# Patient Record
Sex: Female | Born: 1950 | Race: White | Hispanic: No | Smoking: Former smoker
Health system: Southern US, Community
[De-identification: ages and names within clinical notes are randomized; demographics above are authoritative.]

## PROBLEM LIST (undated history)

## (undated) DIAGNOSIS — K635 Polyp of colon: Secondary | ICD-10-CM

## (undated) DIAGNOSIS — I2510 Atherosclerotic heart disease of native coronary artery without angina pectoris: Secondary | ICD-10-CM

## (undated) DIAGNOSIS — I447 Left bundle-branch block, unspecified: Secondary | ICD-10-CM

## (undated) DIAGNOSIS — K219 Gastro-esophageal reflux disease without esophagitis: Secondary | ICD-10-CM

## (undated) DIAGNOSIS — I10 Essential (primary) hypertension: Secondary | ICD-10-CM

## (undated) DIAGNOSIS — E785 Hyperlipidemia, unspecified: Secondary | ICD-10-CM

## (undated) DIAGNOSIS — R7303 Prediabetes: Secondary | ICD-10-CM

## (undated) DIAGNOSIS — I771 Stricture of artery: Secondary | ICD-10-CM

## (undated) DIAGNOSIS — J449 Chronic obstructive pulmonary disease, unspecified: Secondary | ICD-10-CM

## (undated) DIAGNOSIS — Z87442 Personal history of urinary calculi: Secondary | ICD-10-CM

## (undated) HISTORY — PX: BREAST BIOPSY: SHX20

## (undated) HISTORY — PX: TONSILLECTOMY: SUR1361

## (undated) HISTORY — PX: CHOLECYSTECTOMY: SHX55

## (undated) HISTORY — PX: HERNIA REPAIR: SHX51

## (undated) HISTORY — PX: ABDOMINAL HYSTERECTOMY: SHX81

---

## 2013-07-23 DIAGNOSIS — Z800 Family history of malignant neoplasm of digestive organs: Secondary | ICD-10-CM | POA: Insufficient documentation

## 2013-07-23 DIAGNOSIS — D126 Benign neoplasm of colon, unspecified: Secondary | ICD-10-CM | POA: Insufficient documentation

## 2013-07-23 DIAGNOSIS — R197 Diarrhea, unspecified: Secondary | ICD-10-CM | POA: Insufficient documentation

## 2013-08-04 ENCOUNTER — Ambulatory Visit: Payer: Self-pay | Admitting: Physician Assistant

## 2013-08-07 ENCOUNTER — Observation Stay: Payer: Self-pay | Admitting: Family Medicine

## 2013-08-07 LAB — URINALYSIS, COMPLETE
BILIRUBIN, UR: NEGATIVE
Bacteria: NONE SEEN
Blood: NEGATIVE
GLUCOSE, UR: NEGATIVE mg/dL (ref 0–75)
Ketone: NEGATIVE
Leukocyte Esterase: NEGATIVE
Nitrite: NEGATIVE
PH: 5 (ref 4.5–8.0)
Protein: 30
SPECIFIC GRAVITY: 1.024 (ref 1.003–1.030)
WBC UR: 4 /HPF (ref 0–5)

## 2013-08-07 LAB — COMPREHENSIVE METABOLIC PANEL
ALK PHOS: 148 U/L — AB
AST: 34 U/L (ref 15–37)
Albumin: 3.5 g/dL (ref 3.4–5.0)
Anion Gap: 7 (ref 7–16)
BILIRUBIN TOTAL: 0.5 mg/dL (ref 0.2–1.0)
BUN: 6 mg/dL — ABNORMAL LOW (ref 7–18)
CALCIUM: 9.1 mg/dL (ref 8.5–10.1)
CHLORIDE: 100 mmol/L (ref 98–107)
CREATININE: 0.55 mg/dL — AB (ref 0.60–1.30)
Co2: 26 mmol/L (ref 21–32)
EGFR (African American): >60
Glucose: 116 mg/dL — ABNORMAL HIGH (ref 65–99)
Osmolality: 265 (ref 275–301)
POTASSIUM: 4.1 mmol/L (ref 3.5–5.1)
SGPT (ALT): 18 U/L (ref 12–78)
Sodium: 133 mmol/L — ABNORMAL LOW (ref 136–145)
TOTAL PROTEIN: 7.6 g/dL (ref 6.4–8.2)

## 2013-08-07 LAB — CBC WITH DIFFERENTIAL/PLATELET
Basophil #: 0.1 10*3/uL (ref 0.0–0.1)
Basophil %: 0.5 %
EOS ABS: 0 10*3/uL (ref 0.0–0.7)
Eosinophil %: 0.1 %
HCT: 37.2 % (ref 35.0–47.0)
HGB: 12.1 g/dL (ref 12.0–16.0)
LYMPHS ABS: 1 10*3/uL (ref 1.0–3.6)
Lymphocyte %: 7.6 %
MCH: 26.8 pg (ref 26.0–34.0)
MCHC: 32.7 g/dL (ref 32.0–36.0)
MCV: 82 fL (ref 80–100)
Monocyte #: 0.4 x10 3/mm (ref 0.2–0.9)
Monocyte %: 2.9 %
NEUTROS ABS: 11.8 10*3/uL — AB (ref 1.4–6.5)
NEUTROS PCT: 88.9 %
Platelet: 294 10*3/uL (ref 150–440)
RBC: 4.54 10*6/uL (ref 3.80–5.20)
RDW: 14.5 % (ref 11.5–14.5)
WBC: 13.3 10*3/uL — AB (ref 3.6–11.0)

## 2013-08-07 LAB — CK TOTAL AND CKMB (NOT AT ARMC)
CK, TOTAL: 52 U/L
CK, Total: 104 U/L
CK-MB: 1 ng/mL (ref 0.5–3.6)
CK-MB: 0.7 ng/mL (ref 0.5–3.6)

## 2013-08-07 LAB — TROPONIN I

## 2013-08-07 LAB — LIPASE, BLOOD: Lipase: 82 U/L (ref 73–393)

## 2013-08-08 LAB — CBC WITH DIFFERENTIAL/PLATELET
BASOS PCT: 0.2 %
Basophil #: 0 10*3/uL (ref 0.0–0.1)
EOS PCT: 0 %
Eosinophil #: 0 10*3/uL (ref 0.0–0.7)
HCT: 31.4 % — ABNORMAL LOW (ref 35.0–47.0)
HGB: 10.2 g/dL — ABNORMAL LOW (ref 12.0–16.0)
Lymphocyte #: 1 10*3/uL (ref 1.0–3.6)
Lymphocyte %: 6.1 %
MCH: 26.7 pg (ref 26.0–34.0)
MCHC: 32.5 g/dL (ref 32.0–36.0)
MCV: 82 fL (ref 80–100)
MONO ABS: 0.9 x10 3/mm (ref 0.2–0.9)
MONOS PCT: 5.3 %
NEUTROS ABS: 14.4 10*3/uL — AB (ref 1.4–6.5)
Neutrophil %: 88.4 %
PLATELETS: 251 10*3/uL (ref 150–440)
RBC: 3.82 10*6/uL (ref 3.80–5.20)
RDW: 14.5 % (ref 11.5–14.5)
WBC: 16.3 10*3/uL — ABNORMAL HIGH (ref 3.6–11.0)

## 2013-08-08 LAB — BASIC METABOLIC PANEL
Anion Gap: 7 (ref 7–16)
BUN: 7 mg/dL (ref 7–18)
CO2: 28 mmol/L (ref 21–32)
Calcium, Total: 8.4 mg/dL — ABNORMAL LOW (ref 8.5–10.1)
Chloride: 99 mmol/L (ref 98–107)
Creatinine: 0.78 mg/dL (ref 0.60–1.30)
EGFR (Non-African Amer.): >60
GLUCOSE: 111 mg/dL — AB (ref 65–99)
Osmolality: 267 (ref 275–301)
POTASSIUM: 3.5 mmol/L (ref 3.5–5.1)
Sodium: 134 mmol/L — ABNORMAL LOW (ref 136–145)

## 2013-08-08 LAB — CK TOTAL AND CKMB (NOT AT ARMC)
CK, TOTAL: 45 U/L
CK-MB: 0.7 ng/mL (ref 0.5–3.6)

## 2013-08-08 LAB — TROPONIN I: Troponin-I: <0.02 ng/mL

## 2013-10-15 DIAGNOSIS — J449 Chronic obstructive pulmonary disease, unspecified: Secondary | ICD-10-CM | POA: Insufficient documentation

## 2013-10-15 DIAGNOSIS — I10 Essential (primary) hypertension: Secondary | ICD-10-CM | POA: Insufficient documentation

## 2013-10-15 DIAGNOSIS — R079 Chest pain, unspecified: Secondary | ICD-10-CM | POA: Insufficient documentation

## 2014-08-29 NOTE — Discharge Summary (Signed)
PATIENT NAME:  Kimberly Woodward, HOWELLS MR#:  [id number] DATE OF BIRTH:  05-Feb-1951  DATE OF ADMISSION:  08/07/2013 DATE OF DISCHARGE:  08/08/2013  REASON FOR ADMISSION: Chest tightness.  HISTORY OF PRESENT ILLNESS: The patient is a very nice 64 year old female who had a history of colonoscopy done the day of admission with removal of 16 polyps done in  [place]. Apparently, the patient was brought to the medicine department with the initiation of complaints of abdominal pain that was radiating to the chest. The patient did not have any other symptoms like shortness of breath or nausea but the pain was excruciating and was concerning as she had never had it before. The patient was evaluated in the emergency department. Her vital signs were stable with a blood pressure of 141/52. Pulse was 76. She was on 2 liters of oxygen with no significant oxygen desaturation, just as of chest pain. The patient was evaluated with CT scan of the abdomen and pelvis  showing thickening of the walls of the cecum and that recent mucosal edema, nonspecific, likely secondary to an irritation versus infection, inflammation, mostly likely secondary to the recent procedure. There were no signs of intestinal rupture. The patient was kept. Cardiac enzymes were done x 3 without any significant findings. There was no ST depression or elevation on the EKG. Recommended for the patient to follow up with primary care physician for the need of future stress test.  As far as the abdominal pain, as mentioned above, the patient was having some inflammation in the peri-cecal area. Likely this could be related to her procedure done today. The patient was started on Ciprofloxacin and Flagyl, as she had Ct findings and s/p coplonoscopy with polyremoval. The patient did well during this hospitalization. Cardiac enzymes were negative. She was discharged in good condition with treatment of antibiotics of Cipro and Flagyl. The patient also was told to  continue her previous medications including Tudorza 400 mcg 2 times daily, Symbicort 160/4.5 twice daily, Proventil 2 puffs 4 times a day, 100 mcg/25 mcg inhaled once a day, Atrovent inhaler 2 puffs 4 times a day, Coreg 25 mg twice daily, atorvastatin 40 mg once daily, Cipro and Flagyl 13 days.   DISCHARGE TIME: I spent about 40 minutes with this discharge.    ____________________________ [place] Sink, MD rsg:lt D: 08/11/2013 23:27:07 ET T: 08/12/2013 00:12:55 ET JOB#: [id number]  cc: [place] Sink, MD, <Dictator> Dawit Tankard America Brown MD ELECTRONICALLY SIGNED 08/12/2013 21:34

## 2014-08-29 NOTE — H&P (Signed)
PATIENT NAME:  Kimberly Woodward, AMMON MR#:  [id number] DATE OF BIRTH:  Aug 13, 1950  DATE OF ADMISSION:  08/07/2013  PRIMARY CARE PHYSICIAN: [facility]  REFERRING PHYSICIAN: Dr. Lenise Arena  CHIEF COMPLAINT: Chest tightness.   Kimberly Woodward is a 64 year old female who had colonoscopy today and removed 16 polyps; 2 of them were greater than 3 cm. She is brought to the Emergency Department with complaints of abdominal pain and some chest tightness that started a few hours after coming back from the procedure. She did not have any nausea, did not have any bloody stools. The patient has been having cough with mild productive sputum. Continues to smoke. No previous history of coronary artery disease. The patient states tightness in the left side of the chest. No radiation, associated with some shortness of breath.   PAST MEDICAL HISTORY:  1.  Hypertension.  2.  Chronic obstructive pulmonary disease.  3.  Hyperlipidemia.  4.  Polypectomy. The patient had 14 polyps removed in December 2014.   ALLERGIES:  1.  PENICILLIN. 2.  CONTRAST DYE.  3.  IVP DYE.   HOME MEDICATIONS:  1.  Symbicort 1 puff 2 times a day.  2.  Proventil 90 mcg 2 puffs 4 times a day.  3.  Micardis 18 mcg once a day.  4.  Ipratropium 2 puffs 4 times a day.  5.  Cholestyramine 0.5 mg 2 times a day.  6.  Coreg 25 mg 2 times a day.  7.  1 puff once a day.  8.  Atorvastatin 40 mg once a day. 9.  Aspirin 81 mg once a day. 10.  Amlodipine 5 mg once a day.   SOCIAL HISTORY: Continues to smoke 1/2 pack a day. Denies drinking alcohol or using illicit drugs. Lives with her husband.   FAMILY HISTORY: Two brothers died of MI in their early 17s. Another brother had colon cancer.   REVIEW OF SYSTEMS:  CONSTITUTIONAL: Severe generalized weakness.  EYES: No change in vision.  ENT: No change in hearing.  RESPIRATORY: Has been having cough with shortness of breath with any exertion.   CARDIOVASCULAR: Has chest tightness today.   GASTROINTESTINAL: No nausea or vomiting. Has abdominal pain.  GENITOURINARY: No dysuria or hematuria.  SKIN: No rash or lesions.  MUSCULOSKELETAL: Has no joint pains. Has generalized body aches.  SKIN: No rash or lesions.  NEUROLOGIC: No weakness or numbness in any part of the body.   PHYSICAL EXAMINATION:  GENERAL: This is a well-built, well-nourished, age-appropriate female lying down on the bed, not in distress.  VITAL SIGNS: Temperature 97.9, pulse 76, blood pressure 141/52, respiratory rate of 16, oxygen saturation is 95% on 2 liters of oxygen.  HEENT: Head normocephalic, atraumatic. No scleral icterus. Conjunctivae normal. Pupils equal and reactive. Extraocular movements are intact. Mucous membranes moist. No pharyngeal erythema.  NECK: Supple. No lymphadenopathy. No JVD. No carotid bruit. No thyromegaly.  CHEST: No focal tenderness. Has bilateral wheezing, coarse breath sounds.  HEART: S1, S2 regular. No murmurs are heard.  ABDOMEN: Bowel sounds plus. Soft. Has tenderness diffusely along the colon. No rebound or  guarding.  EXTREMITIES: No pedal edema. Pulses 2+.  NEUROLOGIC: The patient is alert, oriented to place, person and time. Cranial nerves II through XII intact. Motor 5/5 in upper and lower extremities.  SKIN: No rash or lesions.  MUSCULOSKELETAL: Good range of motion in all the extremities.   LABORATORY DATA:  CBC: WBC of 13.3, hemoglobin 12.1, platelet count of 294. Troponin  less than 0.02. CMP: BUN 6, creatinine of 0.5. The rest of all the values are within normal limits. CK 104. Chest x-ray, one view portable: No acute cardiopulmonary disease. Urinalysis negative for nitrites and leukocyte esterase. CT abdomen and pelvis shows thickening of the wall of the cecum and adjacent mucosal edema that is nonspecific. No pericolonic inflammation.  ASSESSMENT AND PLAN: Kimberly Woodward is a 64 year old female who comes to the Emergency Department with abdominal pain and chest pain after  having a colonoscopy with multiple polyps removed.  1.  Chest pain. The patient has multiple risk factors: Patient's age, tobacco use, family history, hypertension, hyperlipidemia. Admit the patient to a monitored bed, cycle cardiac enzymes x3. If the patient's cardiac enzymes are negative, the patient can follow up as an outpatient for the stress test.  2.  Abdominal pain. The patient is status post polypectomy, multiple, 2 were more than 3 cm size. CT abdomen and pelvis shows perirectal inflammation in the cecal area, The patient has elevated white blood cell count of 13,000. Keep the patient on Cipro and Flagyl.  3.  Tobacco use: Counseled with the patient for 5 minutes, expressed understanding.  4.  Hypertension, currently well controlled.  5.  Keep the patient on deep vein thrombosis prophylaxis with sequential compression devices.   TIME SPENT: 55 minutes.  ____________________________ Monica Becton, MD pv:sw D: 08/08/2013 01:36:31 ET T: 08/08/2013 02:29:13 ET JOB#: [id number]  cc: Monica Becton, MD, <Dictator> Monica Becton MD ELECTRONICALLY SIGNED 08/21/2013 0:25

## 2016-03-01 ENCOUNTER — Ambulatory Visit
Admission: RE | Admit: 2016-03-01 | Discharge: 2016-03-01 | Disposition: A | Discharge status: Home / Self Care | Payer: Medicare Other | Source: Ambulatory Visit | Attending: Internal Medicine | Admitting: Internal Medicine

## 2016-03-01 ENCOUNTER — Encounter
Admission: RE | Disposition: A | Discharge status: Home / Self Care | Payer: Self-pay | Source: Ambulatory Visit | Attending: Internal Medicine

## 2016-03-01 ENCOUNTER — Encounter: Payer: Self-pay | Admitting: *Deleted

## 2016-03-01 DIAGNOSIS — Z87891 Personal history of nicotine dependence: Secondary | ICD-10-CM | POA: Insufficient documentation

## 2016-03-01 DIAGNOSIS — I10 Essential (primary) hypertension: Secondary | ICD-10-CM | POA: Diagnosis not present

## 2016-03-01 DIAGNOSIS — Z7982 Long term (current) use of aspirin: Secondary | ICD-10-CM | POA: Diagnosis not present

## 2016-03-01 DIAGNOSIS — Z91041 Radiographic dye allergy status: Secondary | ICD-10-CM | POA: Diagnosis not present

## 2016-03-01 DIAGNOSIS — I200 Unstable angina: Secondary | ICD-10-CM | POA: Diagnosis present

## 2016-03-01 DIAGNOSIS — Z79899 Other long term (current) drug therapy: Secondary | ICD-10-CM | POA: Diagnosis not present

## 2016-03-01 DIAGNOSIS — E785 Hyperlipidemia, unspecified: Secondary | ICD-10-CM | POA: Insufficient documentation

## 2016-03-01 DIAGNOSIS — E669 Obesity, unspecified: Secondary | ICD-10-CM | POA: Diagnosis not present

## 2016-03-01 DIAGNOSIS — R9439 Abnormal result of other cardiovascular function study: Secondary | ICD-10-CM | POA: Insufficient documentation

## 2016-03-01 DIAGNOSIS — J449 Chronic obstructive pulmonary disease, unspecified: Secondary | ICD-10-CM | POA: Insufficient documentation

## 2016-03-01 DIAGNOSIS — Z6828 Body mass index (BMI) 28.0-28.9, adult: Secondary | ICD-10-CM | POA: Insufficient documentation

## 2016-03-01 DIAGNOSIS — Z8249 Family history of ischemic heart disease and other diseases of the circulatory system: Secondary | ICD-10-CM | POA: Insufficient documentation

## 2016-03-01 HISTORY — PX: CARDIAC CATHETERIZATION: SHX172

## 2016-03-01 HISTORY — DX: Polyp of colon: K63.5

## 2016-03-01 HISTORY — DX: Essential (primary) hypertension: I10

## 2016-03-01 HISTORY — DX: Chronic obstructive pulmonary disease, unspecified: J44.9

## 2016-03-01 HISTORY — DX: Hyperlipidemia, unspecified: E78.5

## 2016-03-01 SURGERY — LEFT HEART CATH AND CORONARY ANGIOGRAPHY
Anesthesia: Moderate Sedation

## 2016-03-01 SURGERY — LEFT HEART CATH AND CORONARY ANGIOGRAPHY
Anesthesia: Moderate Sedation | Laterality: Left

## 2016-03-01 MED ORDER — SODIUM CHLORIDE 0.9% FLUSH: 3.0000 mL | INTRAVENOUS | Status: DC | PRN

## 2016-03-01 MED ORDER — FAMOTIDINE 20 MG PO TABS
20.0000 mg | ORAL_TABLET | Freq: Once | ORAL | Status: AC
Start: 2016-03-01 — End: 2016-03-01
  Administered 2016-03-01: 20 mg via ORAL

## 2016-03-01 MED ORDER — SODIUM CHLORIDE 0.9% FLUSH: 3.0000 mL | Freq: Two times a day (BID) | INTRAVENOUS | Status: DC

## 2016-03-01 MED ORDER — ONDANSETRON HCL 4 MG/2ML IJ SOLN: 4.0000 mg | Freq: Four times a day (QID) | INTRAMUSCULAR | Status: DC | PRN

## 2016-03-01 MED ORDER — IOPAMIDOL (ISOVUE-300) INJECTION 61%
INTRAVENOUS | Status: DC | PRN
  Administered 2016-03-01: 130 mL via INTRA_ARTERIAL

## 2016-03-01 MED ORDER — FENTANYL CITRATE (PF) 100 MCG/2ML IJ SOLN
INTRAMUSCULAR | Status: DC | PRN
  Administered 2016-03-01: 25 ug via INTRAVENOUS

## 2016-03-01 MED ORDER — FAMOTIDINE 20 MG PO TABS
ORAL_TABLET | ORAL | Status: AC
  Administered 2016-03-01: 20 mg via ORAL
  Filled 2016-03-01: qty 1

## 2016-03-01 MED ORDER — FENTANYL CITRATE (PF) 100 MCG/2ML IJ SOLN
INTRAMUSCULAR | Status: AC
Start: 2016-03-01 — End: 2016-03-01
  Filled 2016-03-01: qty 2

## 2016-03-01 MED ORDER — METHYLPREDNISOLONE SODIUM SUCC 125 MG IJ SOLR
INTRAMUSCULAR | Status: AC
  Administered 2016-03-01: 125 mg via INTRAVENOUS
  Filled 2016-03-01: qty 2

## 2016-03-01 MED ORDER — SODIUM CHLORIDE 0.9 % WEIGHT BASED INFUSION: 3.0000 mL/kg/h | INTRAVENOUS | Status: DC

## 2016-03-01 MED ORDER — HEPARIN (PORCINE) IN NACL 2-0.9 UNIT/ML-% IJ SOLN
INTRAMUSCULAR | Status: AC
  Filled 2016-03-01: qty 500

## 2016-03-01 MED ORDER — ASPIRIN 81 MG PO CHEW: 81.0000 mg | CHEWABLE_TABLET | ORAL | Status: DC

## 2016-03-01 MED ORDER — DIPHENHYDRAMINE HCL 25 MG PO CAPS
50.0000 mg | ORAL_CAPSULE | Freq: Once | ORAL | Status: AC
  Administered 2016-03-01: 50 mg via ORAL

## 2016-03-01 MED ORDER — METHYLPREDNISOLONE SODIUM SUCC 125 MG IJ SOLR
125.0000 mg | Freq: Once | INTRAMUSCULAR | Status: AC
  Administered 2016-03-01: 125 mg via INTRAVENOUS

## 2016-03-01 MED ORDER — SODIUM CHLORIDE 0.9 % IV SOLN: 250.0000 mL | INTRAVENOUS | Status: DC | PRN

## 2016-03-01 MED ORDER — ACETAMINOPHEN 325 MG PO TABS: 650.0000 mg | ORAL_TABLET | ORAL | Status: DC | PRN

## 2016-03-01 MED ORDER — DIPHENHYDRAMINE HCL 25 MG PO CAPS
ORAL_CAPSULE | ORAL | Status: AC
Start: 2016-03-01 — End: 2016-03-01
  Administered 2016-03-01: 50 mg via ORAL
  Filled 2016-03-01: qty 2

## 2016-03-01 MED ORDER — SODIUM CHLORIDE 0.9 % WEIGHT BASED INFUSION: 1.0000 mL/kg/h | INTRAVENOUS | Status: DC

## 2016-03-01 MED ORDER — MIDAZOLAM HCL 2 MG/2ML IJ SOLN
INTRAMUSCULAR | Status: DC | PRN
  Administered 2016-03-01: 1 mg via INTRAVENOUS

## 2016-03-01 MED ORDER — ASPIRIN 81 MG PO CHEW
CHEWABLE_TABLET | ORAL | Status: DC
Start: 2016-03-01 — End: 2016-03-01
  Filled 2016-03-01: qty 1

## 2016-03-01 MED ORDER — MIDAZOLAM HCL 2 MG/2ML IJ SOLN
INTRAMUSCULAR | Status: AC
  Filled 2016-03-01: qty 2

## 2016-03-01 SURGICAL SUPPLY — 9 items
CATH INFINITI 5FR ANG PIGTAIL (CATHETERS) ×3 IMPLANT
CATH INFINITI 5FR JL4 (CATHETERS) ×3 IMPLANT
CATH INFINITI JR4 5F (CATHETERS) ×3 IMPLANT
DEVICE CLOSURE MYNXGRIP 5F (Vascular Products) ×3 IMPLANT
KIT MANI 3VAL PERCEP (MISCELLANEOUS) ×3 IMPLANT
NEEDLE PERC 18GX7CM (NEEDLE) ×3 IMPLANT
PACK CARDIAC CATH (CUSTOM PROCEDURE TRAY) ×3 IMPLANT
SHEATH AVANTI 5FR X 11CM (SHEATH) ×3 IMPLANT
WIRE EMERALD 3MM-J .035X150CM (WIRE) ×3 IMPLANT

## 2016-03-01 NOTE — Discharge Instructions (Signed)
Angiogram, Care After °Refer to this sheet in the next few weeks. These instructions provide you with information about caring for yourself after your procedure. Your health care provider may also give you more specific instructions. Your treatment has been planned according to current medical practices, but problems sometimes occur. Call your health care provider if you have any problems or questions after your procedure. °WHAT TO EXPECT AFTER THE PROCEDURE °After your procedure, it is typical to have the following: °· Bruising at the catheter insertion site that usually fades within 1-2 weeks. °· Blood collecting in the tissue (hematoma) that may be painful to the touch. It should usually decrease in size and tenderness within 1-2 weeks. °HOME CARE INSTRUCTIONS °· Take medicines only as directed by your health care provider. °· You may shower 24-48 hours after the procedure or as directed by your health care provider. Remove the bandage (dressing) and gently wash the site with plain soap and water. Pat the area dry with a clean towel. Do not rub the site, because this may cause bleeding. °· Do not take baths, swim, or use a hot tub until your health care provider approves. °· Check your insertion site every day for redness, swelling, or drainage. °· Do not apply powder or lotion to the site. °· Do not lift over 10 lb (4.5 kg) for 5 days after your procedure or as directed by your health care provider. °· Ask your health care provider when it is okay to: °¨ Return to work or school. °¨ Resume usual physical activities or sports. °¨ Resume sexual activity. °· Do not drive home if you are discharged the same day as the procedure. Have someone else drive you. °· You may drive 24 hours after the procedure unless otherwise instructed by your health care provider. °· Do not operate machinery or power tools for 24 hours after the procedure or as directed by your health care provider. °· If your procedure was done as an  outpatient procedure, which means that you went home the same day as your procedure, a responsible adult should be with you for the first 24 hours after you arrive home. °· Keep all follow-up visits as directed by your health care provider. This is important. °SEEK MEDICAL CARE IF: °· You have a fever. °· You have chills. °· You have increased bleeding from the catheter insertion site. Hold pressure on the site. °SEEK IMMEDIATE MEDICAL CARE IF: °· You have unusual pain at the catheter insertion site. °· You have redness, warmth, or swelling at the catheter insertion site. °· You have drainage (other than a small amount of blood on the dressing) from the catheter insertion site. °· The catheter insertion site is bleeding, and the bleeding does not stop after 30 minutes of holding steady pressure on the site. °· The area near or just beyond the catheter insertion site becomes pale, cool, tingly, or numb. °  °This information is not intended to replace advice given to you by your health care provider. Make sure you discuss any questions you have with your health care provider. °  °Document Released: 11/10/2004 Document Revised: 05/15/2014 Document Reviewed: 09/25/2012 °Elsevier Interactive Patient Education ©2016 [employer] ° °

## 2016-06-13 DIAGNOSIS — I2510 Atherosclerotic heart disease of native coronary artery without angina pectoris: Secondary | ICD-10-CM | POA: Insufficient documentation

## 2016-08-01 ENCOUNTER — Other Ambulatory Visit: Payer: Self-pay | Admitting: Family Medicine

## 2016-08-17 ENCOUNTER — Other Ambulatory Visit: Payer: Self-pay | Admitting: Family Medicine

## 2016-08-17 DIAGNOSIS — Z1231 Encounter for screening mammogram for malignant neoplasm of breast: Secondary | ICD-10-CM

## 2016-08-22 ENCOUNTER — Encounter: Payer: Self-pay | Admitting: Radiology

## 2016-08-22 ENCOUNTER — Ambulatory Visit
Admission: RE | Admit: 2016-08-22 | Discharge: 2016-08-22 | Disposition: A | Discharge status: Home / Self Care | Payer: Medicare Other | Source: Ambulatory Visit | Attending: Family Medicine | Admitting: Family Medicine

## 2016-08-22 DIAGNOSIS — Z1231 Encounter for screening mammogram for malignant neoplasm of breast: Secondary | ICD-10-CM | POA: Insufficient documentation

## 2016-08-30 ENCOUNTER — Other Ambulatory Visit: Payer: Self-pay | Admitting: *Deleted

## 2016-08-30 ENCOUNTER — Inpatient Hospital Stay
Admission: RE | Admit: 2016-08-30 | Discharge: 2016-08-30 | Disposition: A | Discharge status: Home / Self Care | Payer: Self-pay | Source: Ambulatory Visit | Attending: *Deleted | Admitting: *Deleted

## 2016-08-30 DIAGNOSIS — Z9289 Personal history of other medical treatment: Secondary | ICD-10-CM

## 2017-03-27 ENCOUNTER — Other Ambulatory Visit: Payer: Self-pay | Admitting: Family Medicine

## 2017-03-27 DIAGNOSIS — I714 Abdominal aortic aneurysm, without rupture, unspecified: Secondary | ICD-10-CM

## 2017-03-27 DIAGNOSIS — I779 Disorder of arteries and arterioles, unspecified: Secondary | ICD-10-CM

## 2017-03-27 DIAGNOSIS — I739 Peripheral vascular disease, unspecified: Secondary | ICD-10-CM

## 2017-04-03 ENCOUNTER — Other Ambulatory Visit: Payer: Self-pay | Admitting: Family Medicine

## 2017-04-03 DIAGNOSIS — R600 Localized edema: Secondary | ICD-10-CM

## 2017-04-05 ENCOUNTER — Ambulatory Visit
Admission: RE | Admit: 2017-04-05 | Discharge: 2017-04-05 | Disposition: A | Discharge status: Home / Self Care | Payer: Medicare Other | Source: Ambulatory Visit | Attending: Family Medicine | Admitting: Family Medicine

## 2017-04-05 DIAGNOSIS — I714 Abdominal aortic aneurysm, without rupture, unspecified: Secondary | ICD-10-CM

## 2017-04-05 DIAGNOSIS — Z136 Encounter for screening for cardiovascular disorders: Secondary | ICD-10-CM | POA: Diagnosis not present

## 2017-04-05 DIAGNOSIS — I150 Renovascular hypertension: Secondary | ICD-10-CM | POA: Diagnosis not present

## 2017-04-05 DIAGNOSIS — R600 Localized edema: Secondary | ICD-10-CM

## 2017-04-05 DIAGNOSIS — I779 Disorder of arteries and arterioles, unspecified: Secondary | ICD-10-CM

## 2017-04-05 DIAGNOSIS — I700 Atherosclerosis of aorta: Secondary | ICD-10-CM | POA: Diagnosis not present

## 2017-04-05 DIAGNOSIS — I739 Peripheral vascular disease, unspecified: Secondary | ICD-10-CM

## 2017-04-09 ENCOUNTER — Ambulatory Visit: Payer: PRIVATE HEALTH INSURANCE

## 2017-06-25 ENCOUNTER — Other Ambulatory Visit: Payer: Self-pay | Admitting: Family Medicine

## 2017-08-17 DIAGNOSIS — R112 Nausea with vomiting, unspecified: Secondary | ICD-10-CM | POA: Insufficient documentation

## 2017-08-23 DIAGNOSIS — R0989 Other specified symptoms and signs involving the circulatory and respiratory systems: Secondary | ICD-10-CM | POA: Insufficient documentation

## 2017-08-23 DIAGNOSIS — I708 Atherosclerosis of other arteries: Secondary | ICD-10-CM | POA: Insufficient documentation

## 2017-10-11 ENCOUNTER — Other Ambulatory Visit: Payer: Self-pay | Admitting: Family Medicine

## 2017-10-11 DIAGNOSIS — Z1231 Encounter for screening mammogram for malignant neoplasm of breast: Secondary | ICD-10-CM

## 2017-10-29 ENCOUNTER — Ambulatory Visit
Admission: RE | Admit: 2017-10-29 | Discharge: 2017-10-29 | Disposition: A | Discharge status: Home / Self Care | Payer: Medicare Other | Source: Ambulatory Visit | Attending: Family Medicine | Admitting: Family Medicine

## 2017-10-29 DIAGNOSIS — Z1231 Encounter for screening mammogram for malignant neoplasm of breast: Secondary | ICD-10-CM | POA: Diagnosis not present

## 2018-01-15 DIAGNOSIS — E119 Type 2 diabetes mellitus without complications: Secondary | ICD-10-CM | POA: Insufficient documentation

## 2018-01-15 DIAGNOSIS — E669 Obesity, unspecified: Secondary | ICD-10-CM | POA: Insufficient documentation

## 2018-05-22 ENCOUNTER — Emergency Department: Payer: No Typology Code available for payment source

## 2018-05-22 ENCOUNTER — Emergency Department
Admission: EM | Admit: 2018-05-22 | Discharge: 2018-05-22 | Disposition: A | Discharge status: Home / Self Care | Payer: No Typology Code available for payment source | Attending: Student in an Organized Health Care Education/Training Program | Admitting: Student in an Organized Health Care Education/Training Program

## 2018-05-22 ENCOUNTER — Other Ambulatory Visit: Payer: Self-pay

## 2018-05-22 DIAGNOSIS — Y92410 Unspecified street and highway as the place of occurrence of the external cause: Secondary | ICD-10-CM | POA: Diagnosis not present

## 2018-05-22 DIAGNOSIS — Y998 Other external cause status: Secondary | ICD-10-CM | POA: Diagnosis not present

## 2018-05-22 DIAGNOSIS — Z87891 Personal history of nicotine dependence: Secondary | ICD-10-CM | POA: Insufficient documentation

## 2018-05-22 DIAGNOSIS — S299XXA Unspecified injury of thorax, initial encounter: Secondary | ICD-10-CM | POA: Diagnosis present

## 2018-05-22 DIAGNOSIS — M25531 Pain in right wrist: Secondary | ICD-10-CM

## 2018-05-22 DIAGNOSIS — S20219A Contusion of unspecified front wall of thorax, initial encounter: Secondary | ICD-10-CM | POA: Diagnosis not present

## 2018-05-22 DIAGNOSIS — Y9389 Activity, other specified: Secondary | ICD-10-CM | POA: Insufficient documentation

## 2018-05-22 DIAGNOSIS — M25572 Pain in left ankle and joints of left foot: Secondary | ICD-10-CM | POA: Diagnosis not present

## 2018-05-22 DIAGNOSIS — Z79899 Other long term (current) drug therapy: Secondary | ICD-10-CM | POA: Diagnosis not present

## 2018-05-22 DIAGNOSIS — Z7982 Long term (current) use of aspirin: Secondary | ICD-10-CM | POA: Diagnosis not present

## 2018-05-22 DIAGNOSIS — I10 Essential (primary) hypertension: Secondary | ICD-10-CM | POA: Diagnosis not present

## 2018-05-22 DIAGNOSIS — J449 Chronic obstructive pulmonary disease, unspecified: Secondary | ICD-10-CM | POA: Insufficient documentation

## 2018-05-22 LAB — BASIC METABOLIC PANEL
Anion gap: 10 (ref 5–15)
BUN: 11 mg/dL (ref 8–23)
CALCIUM: 8.9 mg/dL (ref 8.9–10.3)
CHLORIDE: 101 mmol/L (ref 98–111)
CO2: 22 mmol/L (ref 22–32)
Creatinine, Ser: 0.66 mg/dL (ref 0.44–1.00)
GFR calc non Af Amer: >60 mL/min (ref >60)
GLUCOSE: 87 mg/dL (ref 70–99)
POTASSIUM: 3.9 mmol/L (ref 3.5–5.1)
Sodium: 133 mmol/L — ABNORMAL LOW (ref 135–145)

## 2018-05-22 LAB — CBC
HCT: 29.6 % — ABNORMAL LOW (ref 36.0–46.0)
HEMOGLOBIN: 9.2 g/dL — AB (ref 12.0–15.0)
MCH: 26.8 pg (ref 26.0–34.0)
MCHC: 31.1 g/dL (ref 30.0–36.0)
MCV: 86.3 fL (ref 80.0–100.0)
NRBC: 0 % (ref 0.0–0.2)
PLATELETS: 353 10*3/uL (ref 150–400)
RBC: 3.43 MIL/uL — AB (ref 3.87–5.11)
RDW: 15.1 % (ref 11.5–15.5)
WBC: 5.3 10*3/uL (ref 4.0–10.5)

## 2018-05-22 LAB — TROPONIN I: Troponin I: <0.03 ng/mL (ref <0.03)

## 2018-05-22 LAB — PROTIME-INR
INR: 0.93
Prothrombin Time: 12.4 seconds (ref 11.4–15.2)

## 2018-05-22 MED ORDER — LIDOCAINE 5 % EX PTCH
1.0000 | MEDICATED_PATCH | CUTANEOUS | Status: DC
  Administered 2018-05-22: 1 via TRANSDERMAL
  Filled 2018-05-22: qty 1

## 2018-05-22 MED ORDER — LIDOCAINE 5 % EX PTCH: 1.0000 | MEDICATED_PATCH | Freq: Two times a day (BID) | CUTANEOUS | 0 refills | Status: AC

## 2018-05-22 MED ORDER — HYDROCODONE-ACETAMINOPHEN 5-325 MG PO TABS
1.0000 | ORAL_TABLET | Freq: Once | ORAL | Status: AC
  Administered 2018-05-22: 1 via ORAL
  Filled 2018-05-22: qty 1

## 2018-05-22 MED ORDER — SODIUM CHLORIDE 0.9% FLUSH: 3.0000 mL | Freq: Once | INTRAVENOUS | Status: DC

## 2018-05-22 MED ORDER — TRAMADOL HCL 50 MG PO TABS: 50.0000 mg | ORAL_TABLET | Freq: Four times a day (QID) | ORAL | 0 refills | Status: AC | PRN
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
## 2018-06-24 ENCOUNTER — Other Ambulatory Visit: Payer: Self-pay | Admitting: Family Medicine

## 2018-06-24 DIAGNOSIS — Z1231 Encounter for screening mammogram for malignant neoplasm of breast: Secondary | ICD-10-CM

## 2018-08-02 DIAGNOSIS — E875 Hyperkalemia: Secondary | ICD-10-CM | POA: Insufficient documentation

## 2018-08-02 DIAGNOSIS — N179 Acute kidney failure, unspecified: Secondary | ICD-10-CM | POA: Insufficient documentation

## 2018-08-02 DIAGNOSIS — E871 Hypo-osmolality and hyponatremia: Secondary | ICD-10-CM | POA: Insufficient documentation

## 2018-08-08 DIAGNOSIS — E271 Primary adrenocortical insufficiency: Secondary | ICD-10-CM | POA: Insufficient documentation

## 2018-10-31 ENCOUNTER — Other Ambulatory Visit: Payer: Self-pay

## 2018-10-31 ENCOUNTER — Ambulatory Visit
Admission: RE | Admit: 2018-10-31 | Discharge: 2018-10-31 | Disposition: A | Discharge status: Home / Self Care | Payer: Medicare Other | Source: Ambulatory Visit | Attending: Family Medicine | Admitting: Family Medicine

## 2018-10-31 DIAGNOSIS — Z1231 Encounter for screening mammogram for malignant neoplasm of breast: Secondary | ICD-10-CM | POA: Diagnosis not present

## 2019-03-10 ENCOUNTER — Ambulatory Visit (INDEPENDENT_AMBULATORY_CARE_PROVIDER_SITE_OTHER): Payer: Medicare Other | Admitting: Urology

## 2019-03-10 ENCOUNTER — Other Ambulatory Visit: Payer: Self-pay

## 2019-03-10 ENCOUNTER — Encounter: Payer: Self-pay | Admitting: Urology

## 2019-03-10 VITALS — BP 145/80 | HR 87 | Ht 62.0 in | Wt 180.0 lb

## 2019-03-10 DIAGNOSIS — Z87442 Personal history of urinary calculi: Secondary | ICD-10-CM | POA: Diagnosis not present

## 2019-03-10 DIAGNOSIS — R1032 Left lower quadrant pain: Secondary | ICD-10-CM | POA: Diagnosis not present

## 2019-03-10 DIAGNOSIS — R350 Frequency of micturition: Secondary | ICD-10-CM

## 2019-03-10 LAB — MICROSCOPIC EXAMINATION

## 2019-03-10 LAB — URINALYSIS, COMPLETE
Bilirubin, UA: NEGATIVE
Glucose, UA: NEGATIVE
Ketones, UA: NEGATIVE
Nitrite, UA: NEGATIVE
Protein,UA: NEGATIVE
Specific Gravity, UA: 1.02 (ref 1.005–1.030)
Urobilinogen, Ur: 0.2 mg/dL (ref 0.2–1.0)
pH, UA: 5.5 (ref 5.0–7.5)

## 2019-03-10 MED ORDER — TAMSULOSIN HCL 0.4 MG PO CAPS: 0.4000 mg | ORAL_CAPSULE | Freq: Every day | ORAL | 0 refills | Status: DC

## 2019-03-10 NOTE — Progress Notes (Signed)
03/10/2019 8:35 AM are not  Kimberly Woodward Jan 23, 1951 [id number]  Referring provider: Clarisse Gouge, MD [address] [place],  [place] [address]  Chief Complaint  Patient presents with  . Nephrolithiasis    HPI: Kimberly Woodward is a 68 y.o. female who presents for possible kidney stone.  Her husband who had been in hospice care and ill for several months recently passed away.  She states back in March 2020 she developed intermittent left flank pain.  The pain was mild to moderate without identifiable precipitating, aggravating or alleviating factors.  She recently developed left lower quadrant abdominal pain associated with pelvic pressure.  She does have frequency, urgency and occasional sensation of incomplete emptying.  Denies fever, chills, nausea or vomiting.  She does have a previous history of stone disease but has never required surgical intervention.  An abdominal ultrasound performed in 2018 showed no urinary calculi.  A CT of the abdomen pelvis in 2015 showed 2 "tiny stones" in the left kidney.  The images are not available for review.   PMH: Past Medical History:  Diagnosis Date  . Colon polyps   . COPD (chronic obstructive pulmonary disease) ([place])   . Hyperlipidemia   . Hypertension     Surgical History: Past Surgical History:  Procedure Laterality Date  . ABDOMINAL HYSTERECTOMY    . BREAST BIOPSY Right    neg  . CARDIAC CATHETERIZATION Left 03/01/2016   Procedure: Left Heart Cath and Coronary Angiography;  Surgeon: Yolonda Kida, MD;  Location: [facility];  Service: Cardiovascular;  Laterality: Left;  . CHOLECYSTECTOMY    . HERNIA REPAIR    . TONSILLECTOMY      Home Medications:  Allergies as of 03/10/2019      Reactions   Ivp Dye [iodinated Diagnostic Agents]    Penicillins Hives      Medication List       Accurate as of March 10, 2019  8:35 AM. If you have any questions, ask your nurse or doctor.        albuterol 108 (90 Base) MCG/ACT inhaler Commonly known as: VENTOLIN HFA Inhale into the lungs.   ALPRAZolam 0.25 MG tablet Commonly known as: XANAX Take by mouth.   amLODipine 10 MG tablet Commonly known as: NORVASC Take 10 mg by mouth daily. What changed: Another medication with the same name was removed. Continue taking this medication, and follow the directions you see here. Changed by: Abbie Sons, MD   Anoro Ellipta 62.5-25 MCG/INH Aepb Generic drug: umeclidinium-vilanterol INL 1 PUFF ITL QD   aspirin 81 MG chewable tablet Chew by mouth daily.   atorvastatin 10 MG tablet Commonly known as: LIPITOR Take 10 mg by mouth daily.   cholestyramine light 4 GM/DOSE powder Commonly known as: PREVALITE Take by mouth 2 (two) times daily between meals as needed.   citalopram 20 MG tablet Commonly known as: CELEXA Take 20 mg by mouth daily.   clopidogrel 75 MG tablet Commonly known as: PLAVIX Take 75 mg by mouth daily.   cyanocobalamin 1000 MCG/ML injection Commonly known as: (VITAMIN B-12) Inject into the muscle.   diclofenac sodium 1 % Gel Commonly known as: VOLTAREN SMARTSIG:4 Gram(s) Topical 3 Times Daily PRN   diphenhydrAMINE 50 MG tablet Commonly known as: BENADRYL Take 50 mg by mouth 4 (four) times daily. Pre procedure   DULoxetine 30 MG capsule Commonly known as: CYMBALTA Take 30 mg by mouth daily.   escitalopram 10 MG  tablet Commonly known as: LEXAPRO Take 10 mg by mouth daily.   esomeprazole 40 MG capsule Commonly known as: NEXIUM Take by mouth.   furosemide 20 MG tablet Commonly known as: LASIX Take by mouth.   gabapentin 300 MG capsule Commonly known as: NEURONTIN 300 mg nightly.   hydrocortisone 5 MG tablet Commonly known as: CORTEF Take 5 mg by mouth at bedtime.   hydrocortisone 10 MG tablet Commonly known as: CORTEF TK 1 T PO QAM AND 1/2 T QPM   ipratropium 0.02 % nebulizer solution Commonly known as: ATROVENT Take 0.5 mg  by nebulization every 4 (four) hours as needed for wheezing or shortness of breath.   ipratropium 0.06 % nasal spray Commonly known as: ATROVENT Place 2 sprays into both nostrils 4 (four) times daily as needed for rhinitis.   isosorbide mononitrate 30 MG 24 hr tablet Commonly known as: IMDUR Take 30 mg by mouth every morning.   lidocaine 5 % Commonly known as: Lidoderm Place 1 patch onto the skin every 12 (twelve) hours. Remove & Discard patch within 12 hours or as directed by MD   metoprolol succinate 25 MG 24 hr tablet Commonly known as: TOPROL-XL Take 25 mg by mouth daily.   nitrofurantoin 25 MG/5ML suspension Commonly known as: FURADANTIN Take by mouth 4 (four) times daily.   nitroGLYCERIN 0.4 MG SL tablet Commonly known as: NITROSTAT Place under the tongue.   oxyCODONE-acetaminophen 5-325 MG tablet Commonly known as: PERCOCET/ROXICET Take 1 tablet by mouth every 6 (six) hours as needed.   potassium chloride 10 MEQ tablet Commonly known as: KLOR-CON Take by mouth.   predniSONE 20 MG tablet Commonly known as: DELTASONE Take 60 mg by mouth every 4 (four) hours. Pre procedure   ranitidine 150 MG capsule Commonly known as: ZANTAC Take 150 mg by mouth once. Pre procedure   Roflumilast 250 MCG Tabs Take by mouth.   Symbicort 160-4.5 MCG/ACT inhaler Generic drug: budesonide-formoterol Inhale into the lungs.   telmisartan 80 MG tablet Commonly known as: MICARDIS Take 80 mg by mouth daily.   traMADol 50 MG tablet Commonly known as: Ultram Take 1 tablet (50 mg total) by mouth every 6 (six) hours as needed.   triamterene-hydrochlorothiazide 37.5-25 MG tablet Commonly known as: MAXZIDE-25 Take 1 tablet by mouth daily.       Allergies:  Allergies  Allergen Reactions  . Ivp Dye [Iodinated Diagnostic Agents]   . Penicillins Hives    Family History: Family History  Problem Relation Age of Onset  . Breast cancer Maternal Aunt     Social History:   reports that she quit smoking about 4 years ago. Her smoking use included cigarettes. She has a 25.00 pack-year smoking history. She has never used smokeless tobacco. She reports that she does not drink alcohol or use drugs.  ROS: UROLOGY Frequent Urination?: No Hard to postpone urination?: Yes Burning/pain with urination?: Yes Get up at night to urinate?: Yes Leakage of urine?: Yes Urine stream starts and stops?: Yes Trouble starting stream?: Yes Do you have to strain to urinate?: Yes Blood in urine?: No Urinary tract infection?: No Sexually transmitted disease?: No Injury to kidneys or bladder?: No Painful intercourse?: No Weak stream?: No Currently pregnant?: No Vaginal bleeding?: No Last menstrual period?: N/A  Gastrointestinal Nausea?: No Vomiting?: No Indigestion/heartburn?: No Diarrhea?: No Constipation?: No  Constitutional Fever: No Night sweats?: No Weight loss?: No Fatigue?: Yes  Skin Skin rash/lesions?: No Itching?: No  Eyes Blurred vision?: Yes Double vision?: No  Ears/Nose/Throat Sore throat?: No Sinus problems?: No  Hematologic/Lymphatic Swollen glands?: No Easy bruising?: Yes  Cardiovascular Leg swelling?: Yes Chest pain?: No  Respiratory Cough?: No Shortness of breath?: Yes  Endocrine Excessive thirst?: No  Musculoskeletal Back pain?: Yes Joint pain?: Yes  Neurological Headaches?: No Dizziness?: Yes  Psychologic Depression?: Yes Anxiety?: Yes  Physical Exam: BP (!) 145/80 (BP Location: Left Arm, Patient Position: Sitting, Cuff Size: Normal)   Pulse 87   Ht 5\' 2"  (1.575 m)   Wt 180 lb (81.6 kg)   BMI 32.92 kg/m   Constitutional:  Alert and oriented, No acute distress. HEENT: [place] AT, moist mucus membranes.  Trachea midline, no masses. Cardiovascular: No clubbing, cyanosis, or edema. Respiratory: Normal respiratory effort, no increased work of breathing. GI: Abdomen is soft, mild left lower quadrant tenderness,  nondistended, no abdominal masses GU: No CVA tenderness Lymph: No cervical or inguinal lymphadenopathy. Skin: No rashes, bruises or suspicious lesions. Neurologic: Grossly intact, no focal deficits, moving all 4 extremities. Psychiatric: Normal mood and affect.  Laboratory Data:  Urinalysis Dipstick trace intact blood/1+ leukocytes Microscopy 6-10 WBC   Assessment & Plan:    - Left lower quadrant abdominal pain Prior history of stone disease.  Urinalysis today shows pyuria and a urine culture was ordered.  Stone protocol CT was ordered and she will be notified with results.  Rx tamsulosin was sent to pharmacy.   Abbie Sons, [place] [address], [place], [place] [address] [phone]

## 2019-03-12 LAB — CULTURE, URINE COMPREHENSIVE

## 2019-03-13 ENCOUNTER — Telehealth: Payer: Self-pay | Admitting: Urology

## 2019-03-13 ENCOUNTER — Other Ambulatory Visit: Payer: Self-pay | Admitting: Urology

## 2019-03-13 MED ORDER — SULFAMETHOXAZOLE-TRIMETHOPRIM 800-160 MG PO TABS: 1.0000 | ORAL_TABLET | Freq: Two times a day (BID) | ORAL | 0 refills | Status: DC

## 2019-03-13 NOTE — Telephone Encounter (Signed)
Called pt informed her of the information below. Pt gave verbal understanding.  

## 2019-03-13 NOTE — Telephone Encounter (Signed)
Urine culture was positive for infection.  I sent in an antibiotic Rx to her pharmacy.  This may be the cause of her urinary symptoms.

## 2019-03-18 ENCOUNTER — Ambulatory Visit
Admission: RE | Admit: 2019-03-18 | Discharge: 2019-03-18 | Disposition: A | Discharge status: Home / Self Care | Payer: Medicare Other | Source: Ambulatory Visit | Attending: Urology | Admitting: Urology

## 2019-03-18 ENCOUNTER — Other Ambulatory Visit: Payer: Self-pay

## 2019-03-18 DIAGNOSIS — R1032 Left lower quadrant pain: Secondary | ICD-10-CM | POA: Diagnosis present

## 2019-03-18 DIAGNOSIS — Z87442 Personal history of urinary calculi: Secondary | ICD-10-CM | POA: Diagnosis present

## 2019-03-19 ENCOUNTER — Telehealth: Payer: Self-pay

## 2019-03-19 NOTE — Telephone Encounter (Signed)
Called pt informed her of the information below, pt gave verbal understanding.

## 2019-03-19 NOTE — Telephone Encounter (Signed)
-----   Message from Abbie Sons, MD sent at 03/18/2019  4:22 PM EST ----- CT showed no calculi or renal abnormalities.  Her bladder symptoms were most likely secondary to UTI.  Would recommend PCP follow-up for her back pain.  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  And aortic ultrasound was recommended in 5 years.

## 2019-05-20 ENCOUNTER — Ambulatory Visit: Payer: Medicare HMO | Attending: Internal Medicine

## 2019-05-20 DIAGNOSIS — Z20822 Contact with and (suspected) exposure to covid-19: Secondary | ICD-10-CM

## 2019-05-22 ENCOUNTER — Telehealth: Payer: Self-pay | Admitting: General Practice

## 2019-05-22 LAB — NOVEL CORONAVIRUS, NAA: SARS-CoV-2, NAA: NOT DETECTED

## 2019-05-22 NOTE — Telephone Encounter (Signed)
Negative COVID results given. Patient results "NOT Detected." Caller expressed understanding. ° °

## 2019-06-10 ENCOUNTER — Other Ambulatory Visit: Payer: Self-pay | Admitting: Orthopedic Surgery

## 2019-06-10 DIAGNOSIS — M2392 Unspecified internal derangement of left knee: Secondary | ICD-10-CM

## 2019-06-10 DIAGNOSIS — M2352 Chronic instability of knee, left knee: Secondary | ICD-10-CM

## 2019-06-10 DIAGNOSIS — G8929 Other chronic pain: Secondary | ICD-10-CM

## 2019-06-10 DIAGNOSIS — M25562 Pain in left knee: Secondary | ICD-10-CM

## 2019-06-19 ENCOUNTER — Other Ambulatory Visit: Payer: Self-pay

## 2019-06-19 ENCOUNTER — Ambulatory Visit
Admission: RE | Admit: 2019-06-19 | Discharge: 2019-06-19 | Disposition: A | Discharge status: Home / Self Care | Payer: Medicare HMO | Source: Ambulatory Visit | Attending: Orthopedic Surgery | Admitting: Orthopedic Surgery

## 2019-06-19 DIAGNOSIS — M25562 Pain in left knee: Secondary | ICD-10-CM | POA: Diagnosis present

## 2019-06-19 DIAGNOSIS — G8929 Other chronic pain: Secondary | ICD-10-CM | POA: Insufficient documentation

## 2019-06-19 DIAGNOSIS — M2392 Unspecified internal derangement of left knee: Secondary | ICD-10-CM | POA: Insufficient documentation

## 2019-06-19 DIAGNOSIS — M2352 Chronic instability of knee, left knee: Secondary | ICD-10-CM | POA: Insufficient documentation

## 2019-07-03 ENCOUNTER — Other Ambulatory Visit: Payer: Self-pay | Admitting: Surgery

## 2019-07-09 ENCOUNTER — Encounter
Admission: RE | Admit: 2019-07-09 | Discharge: 2019-07-09 | Disposition: A | Discharge status: Home / Self Care | Payer: Medicare HMO | Source: Ambulatory Visit | Attending: Surgery | Admitting: Surgery

## 2019-07-09 ENCOUNTER — Other Ambulatory Visit: Payer: Self-pay

## 2019-07-09 DIAGNOSIS — Z01812 Encounter for preprocedural laboratory examination: Secondary | ICD-10-CM | POA: Insufficient documentation

## 2019-07-09 DIAGNOSIS — Z20822 Contact with and (suspected) exposure to covid-19: Secondary | ICD-10-CM | POA: Diagnosis not present

## 2019-07-09 HISTORY — DX: Gastro-esophageal reflux disease without esophagitis: K21.9

## 2019-07-09 HISTORY — DX: Atherosclerotic heart disease of native coronary artery without angina pectoris: I25.10

## 2019-07-09 HISTORY — DX: Prediabetes: R73.03

## 2019-07-09 HISTORY — DX: Personal history of urinary calculi: Z87.442

## 2019-07-09 NOTE — Patient Instructions (Signed)
Your COVID test is scheduled on:  Friday 07/11/19.  Drive up in front of [place] any time 8:00-10:30 am.  Your procedure is scheduled on: Tuesday 07/15/19 Report to Same Day Surgery (Enter through [facility], take elevator to 2nd floor.  Check in with surgery information desk.) To find out your arrival time, call [phone] 1:00-3:00 PM on Monday 07/14/19  Remember: Instructions that are not followed completely may result in serious medical risk, up to and including death, or upon the discretion of your surgeon and anesthesiologist your surgery may need to be rescheduled.   __x__ 1. Do not eat food (including mints, candies, chewing gum) after midnight the night before your procedure. You may drink clear liquids up to 2 hours before you are scheduled to arrive at the hospital for your procedure.  Do not drink anything within 2 hours of your scheduled arrival to the hospital.  Approved clear liquids:  --Water or Apple juice without pulp  --Clear carbohydrate beverage such as Gatorade or Powerade  --Black Coffee or Clear Tea (No milk, no creamers, do not add anything to the coffee or tea)   __x__ 2. Finish your provided Ensure clear beverage 2 hours before your scheduled arrival time on the morning of surgery.  __x__ 3. No Alcohol or smoking for 24 hours before or after surgery.  __x__ 4. Notify your doctor if there is any change in your medical condition (cold, fever, infections).  __x__ 5. On the morning of surgery brush your teeth with toothpaste and water.  You may rinse your mouth with mouthwash if you wish.  Do not swallow any toothpaste or mouthwash.  Please read over the following fact sheets that you were given: [facility] Preparing for Surgery, MRSA Information, How to Use an Incentive Spirometer   __x__ Use CHG Soap provided as directed on instruction sheet.  Do not wear jewelry, make-up, hairpins, clips or nail polish on the day of surgery. Do not wear lotions,  powders, deodorant, or perfumes.  Do not shave below the face/neck 48 hours prior to surgery.  Do not bring valuables to the hospital.  [facility] is not responsible for any belongings or valuables.   Dentures may not be worn into surgery. For patients discharged on the day of surgery, you will NOT be permitted to drive yourself home.  You must have a responsible adult with you for 24 hours after surgery.  __x__ Take these medicines on the morning of surgery with a SMALL SIP OF WATER:  1. Carvedilol (Coreg)  2. Isosorbide (Imdur)  3. Duloxetine (Cymbalta)  4. Escitalopram (Lexapro)  5. Esomeprazole (Nexium)  6. Hydrocortisone (Cortef)  7. Atorvastatin (Lipitor)  Skip your Triamterene-Hydrochlorothiazide (Maxzide) only on the morning of surgery.  You do not need to stop this ahead of your surgery.  __x__ Use your inhalers (Albuterol, Stiolto, Anoro) on the day of surgery.  __x__ Follow recommendations from Cardiologist regarding stopping Aspirin, Plavix, Coumadin, Eliquis, Effient, Pradaxa, and Pletal.  __x__ STARTING TODAY UNTIL AFTER SURGERY: Stop Anti-inflammatories such as Advil, Ibuprofen, Motrin, Aleve, Naproxen, Naprosyn, BC/Goodies powders or aspirin products. You may continue to take Tylenol and Celebrex.   __x__ STARTING TODAY UNTIL AFTER SURGERY: Do not take any over the counter vitamins/supplements. You may continue to take your Potassium, Vitamin D, Vitamin B.  RN reviewed instructions via telephone interview.  Patient expressed understanding and will receive printed copy on date of COVID test.   ________________________ Phone call 07/09/19 @ 10:42 am

## 2019-07-11 ENCOUNTER — Other Ambulatory Visit
Admission: RE | Admit: 2019-07-11 | Discharge: 2019-07-11 | Disposition: A | Discharge status: Home / Self Care | Payer: Medicare HMO | Source: Ambulatory Visit | Attending: Surgery | Admitting: Surgery

## 2019-07-11 ENCOUNTER — Other Ambulatory Visit: Payer: Self-pay

## 2019-07-11 DIAGNOSIS — Z01812 Encounter for preprocedural laboratory examination: Secondary | ICD-10-CM | POA: Diagnosis not present

## 2019-07-11 LAB — CBC
HCT: 34.9 % — ABNORMAL LOW (ref 36.0–46.0)
Hemoglobin: 11.4 g/dL — ABNORMAL LOW (ref 12.0–15.0)
MCH: 27.4 pg (ref 26.0–34.0)
MCHC: 32.7 g/dL (ref 30.0–36.0)
MCV: 83.9 fL (ref 80.0–100.0)
Platelets: 278 10*3/uL (ref 150–400)
RBC: 4.16 MIL/uL (ref 3.87–5.11)
RDW: 13.7 % (ref 11.5–15.5)
WBC: 4.8 10*3/uL (ref 4.0–10.5)
nRBC: 0 % (ref 0.0–0.2)

## 2019-07-11 LAB — BASIC METABOLIC PANEL
Anion gap: 11 (ref 5–15)
BUN: 14 mg/dL (ref 8–23)
CO2: 26 mmol/L (ref 22–32)
Calcium: 9.4 mg/dL (ref 8.9–10.3)
Chloride: 94 mmol/L — ABNORMAL LOW (ref 98–111)
Creatinine, Ser: 1.01 mg/dL — ABNORMAL HIGH (ref 0.44–1.00)
GFR calc Af Amer: >60 mL/min (ref >60)
GFR calc non Af Amer: 57 mL/min — ABNORMAL LOW (ref >60)
Glucose, Bld: 102 mg/dL — ABNORMAL HIGH (ref 70–99)
Potassium: 4.4 mmol/L (ref 3.5–5.1)
Sodium: 131 mmol/L — ABNORMAL LOW (ref 135–145)

## 2019-07-11 LAB — SARS CORONAVIRUS 2 (TAT 6-24 HRS): SARS Coronavirus 2: NEGATIVE

## 2019-07-15 ENCOUNTER — Encounter
Admission: RE | Disposition: A | Discharge status: Home / Self Care | Payer: Self-pay | Source: Ambulatory Visit | Attending: Surgery

## 2019-07-15 ENCOUNTER — Ambulatory Visit: Payer: Medicare HMO | Admitting: Anesthesiology

## 2019-07-15 ENCOUNTER — Ambulatory Visit
Admission: RE | Admit: 2019-07-15 | Discharge: 2019-07-15 | Disposition: A | Discharge status: Home / Self Care | Payer: Medicare HMO | Source: Ambulatory Visit | Attending: Surgery | Admitting: Surgery

## 2019-07-15 ENCOUNTER — Encounter: Payer: Self-pay | Admitting: Surgery

## 2019-07-15 ENCOUNTER — Other Ambulatory Visit: Payer: Self-pay

## 2019-07-15 DIAGNOSIS — I771 Stricture of artery: Secondary | ICD-10-CM | POA: Insufficient documentation

## 2019-07-15 DIAGNOSIS — Z7982 Long term (current) use of aspirin: Secondary | ICD-10-CM | POA: Diagnosis not present

## 2019-07-15 DIAGNOSIS — Z888 Allergy status to other drugs, medicaments and biological substances status: Secondary | ICD-10-CM | POA: Insufficient documentation

## 2019-07-15 DIAGNOSIS — I10 Essential (primary) hypertension: Secondary | ICD-10-CM | POA: Diagnosis not present

## 2019-07-15 DIAGNOSIS — Z7902 Long term (current) use of antithrombotics/antiplatelets: Secondary | ICD-10-CM | POA: Insufficient documentation

## 2019-07-15 DIAGNOSIS — Z87891 Personal history of nicotine dependence: Secondary | ICD-10-CM | POA: Insufficient documentation

## 2019-07-15 DIAGNOSIS — M23222 Derangement of posterior horn of medial meniscus due to old tear or injury, left knee: Secondary | ICD-10-CM | POA: Insufficient documentation

## 2019-07-15 DIAGNOSIS — J449 Chronic obstructive pulmonary disease, unspecified: Secondary | ICD-10-CM | POA: Diagnosis not present

## 2019-07-15 DIAGNOSIS — M6752 Plica syndrome, left knee: Secondary | ICD-10-CM | POA: Insufficient documentation

## 2019-07-15 DIAGNOSIS — M23242 Derangement of anterior horn of lateral meniscus due to old tear or injury, left knee: Secondary | ICD-10-CM | POA: Insufficient documentation

## 2019-07-15 DIAGNOSIS — E785 Hyperlipidemia, unspecified: Secondary | ICD-10-CM | POA: Diagnosis not present

## 2019-07-15 DIAGNOSIS — M1712 Unilateral primary osteoarthritis, left knee: Secondary | ICD-10-CM | POA: Diagnosis not present

## 2019-07-15 DIAGNOSIS — R7303 Prediabetes: Secondary | ICD-10-CM | POA: Diagnosis not present

## 2019-07-15 DIAGNOSIS — I447 Left bundle-branch block, unspecified: Secondary | ICD-10-CM | POA: Diagnosis not present

## 2019-07-15 DIAGNOSIS — Z95818 Presence of other cardiac implants and grafts: Secondary | ICD-10-CM | POA: Diagnosis not present

## 2019-07-15 DIAGNOSIS — I2510 Atherosclerotic heart disease of native coronary artery without angina pectoris: Secondary | ICD-10-CM | POA: Insufficient documentation

## 2019-07-15 DIAGNOSIS — Z91041 Radiographic dye allergy status: Secondary | ICD-10-CM | POA: Insufficient documentation

## 2019-07-15 DIAGNOSIS — Z79899 Other long term (current) drug therapy: Secondary | ICD-10-CM | POA: Diagnosis not present

## 2019-07-15 DIAGNOSIS — Z880 Allergy status to penicillin: Secondary | ICD-10-CM | POA: Insufficient documentation

## 2019-07-15 HISTORY — PX: KNEE ARTHROSCOPY WITH MEDIAL MENISECTOMY: SHX5651

## 2019-07-15 SURGERY — ARTHROSCOPY, KNEE, WITH MEDIAL MENISCECTOMY
Anesthesia: General | Site: Knee | Laterality: Left

## 2019-07-15 MED ORDER — CHLORHEXIDINE GLUCONATE 4 % EX LIQD: 60.0000 mL | Freq: Once | CUTANEOUS | Status: DC

## 2019-07-15 MED ORDER — CLINDAMYCIN PHOSPHATE 900 MG/50ML IV SOLN
INTRAVENOUS | Status: AC
  Filled 2019-07-15: qty 50

## 2019-07-15 MED ORDER — ONDANSETRON HCL 4 MG PO TABS: 4.0000 mg | ORAL_TABLET | Freq: Four times a day (QID) | ORAL | Status: DC | PRN

## 2019-07-15 MED ORDER — LIDOCAINE HCL 1 % IJ SOLN
INTRAMUSCULAR | Status: DC | PRN
  Administered 2019-07-15: 30 mL

## 2019-07-15 MED ORDER — GLYCOPYRROLATE 0.2 MG/ML IJ SOLN
INTRAMUSCULAR | Status: AC
  Filled 2019-07-15: qty 1

## 2019-07-15 MED ORDER — ONDANSETRON HCL 4 MG/2ML IJ SOLN
INTRAMUSCULAR | Status: AC
  Filled 2019-07-15: qty 2

## 2019-07-15 MED ORDER — SUGAMMADEX SODIUM 500 MG/5ML IV SOLN
INTRAVENOUS | Status: DC | PRN
  Administered 2019-07-15: 150 mg via INTRAVENOUS

## 2019-07-15 MED ORDER — FENTANYL CITRATE (PF) 100 MCG/2ML IJ SOLN: 25.0000 ug | INTRAMUSCULAR | Status: DC | PRN

## 2019-07-15 MED ORDER — CLINDAMYCIN PHOSPHATE 900 MG/50ML IV SOLN
900.0000 mg | INTRAVENOUS | Status: AC
  Administered 2019-07-15: 900 mg via INTRAVENOUS

## 2019-07-15 MED ORDER — ONDANSETRON HCL 4 MG/2ML IJ SOLN: 4.0000 mg | Freq: Once | INTRAMUSCULAR | Status: DC | PRN

## 2019-07-15 MED ORDER — HYDROCODONE-ACETAMINOPHEN 7.5-325 MG PO TABS
1.0000 | ORAL_TABLET | ORAL | Status: DC | PRN
  Filled 2019-07-15: qty 2

## 2019-07-15 MED ORDER — EPINEPHRINE PF 1 MG/ML IJ SOLN
INTRAMUSCULAR | Status: AC
  Filled 2019-07-15: qty 2

## 2019-07-15 MED ORDER — MIDAZOLAM HCL 2 MG/2ML IJ SOLN
INTRAMUSCULAR | Status: DC | PRN
  Administered 2019-07-15: 2 mg via INTRAVENOUS

## 2019-07-15 MED ORDER — EPHEDRINE 5 MG/ML INJ
INTRAVENOUS | Status: AC
  Filled 2019-07-15: qty 10

## 2019-07-15 MED ORDER — BUPIVACAINE-EPINEPHRINE (PF) 0.5% -1:200000 IJ SOLN
INTRAMUSCULAR | Status: AC
  Filled 2019-07-15: qty 30

## 2019-07-15 MED ORDER — IPRATROPIUM-ALBUTEROL 0.5-2.5 (3) MG/3ML IN SOLN
3.0000 mL | Freq: Once | RESPIRATORY_TRACT | Status: AC
  Administered 2019-07-15: 3 mL via RESPIRATORY_TRACT

## 2019-07-15 MED ORDER — FENTANYL CITRATE (PF) 100 MCG/2ML IJ SOLN
INTRAMUSCULAR | Status: DC | PRN
  Administered 2019-07-15: 50 ug via INTRAVENOUS

## 2019-07-15 MED ORDER — LACTATED RINGERS IV SOLN
INTRAVENOUS | Status: DC
  Administered 2019-07-15: 1000 mL via INTRAVENOUS

## 2019-07-15 MED ORDER — DEXAMETHASONE SODIUM PHOSPHATE 10 MG/ML IJ SOLN
INTRAMUSCULAR | Status: AC
  Filled 2019-07-15: qty 1

## 2019-07-15 MED ORDER — METOCLOPRAMIDE HCL 10 MG PO TABS: 5.0000 mg | ORAL_TABLET | Freq: Three times a day (TID) | ORAL | Status: DC | PRN

## 2019-07-15 MED ORDER — LIDOCAINE HCL (PF) 2 % IJ SOLN
INTRAMUSCULAR | Status: AC
  Filled 2019-07-15: qty 10

## 2019-07-15 MED ORDER — ROCURONIUM BROMIDE 100 MG/10ML IV SOLN
INTRAVENOUS | Status: DC | PRN
  Administered 2019-07-15: 50 mg via INTRAVENOUS

## 2019-07-15 MED ORDER — PHENYLEPHRINE HCL (PRESSORS) 10 MG/ML IV SOLN
INTRAVENOUS | Status: DC | PRN
  Administered 2019-07-15 (×2): 100 ug via INTRAVENOUS

## 2019-07-15 MED ORDER — LIDOCAINE HCL (PF) 1 % IJ SOLN
INTRAMUSCULAR | Status: AC
  Filled 2019-07-15: qty 30

## 2019-07-15 MED ORDER — IPRATROPIUM-ALBUTEROL 0.5-2.5 (3) MG/3ML IN SOLN
RESPIRATORY_TRACT | Status: AC
  Filled 2019-07-15: qty 3

## 2019-07-15 MED ORDER — HYDROCODONE-ACETAMINOPHEN 5-325 MG PO TABS: 1.0000 | ORAL_TABLET | ORAL | Status: DC | PRN

## 2019-07-15 MED ORDER — GLYCOPYRROLATE 0.2 MG/ML IJ SOLN
INTRAMUSCULAR | Status: DC | PRN
  Administered 2019-07-15: .1 mg via INTRAVENOUS

## 2019-07-15 MED ORDER — DEXAMETHASONE SODIUM PHOSPHATE 10 MG/ML IJ SOLN
INTRAMUSCULAR | Status: DC | PRN
  Administered 2019-07-15: 10 mg via INTRAVENOUS

## 2019-07-15 MED ORDER — ONDANSETRON HCL 4 MG/2ML IJ SOLN: 4.0000 mg | Freq: Four times a day (QID) | INTRAMUSCULAR | Status: DC | PRN

## 2019-07-15 MED ORDER — ROCURONIUM BROMIDE 10 MG/ML (PF) SYRINGE
PREFILLED_SYRINGE | INTRAVENOUS | Status: AC
  Filled 2019-07-15: qty 10

## 2019-07-15 MED ORDER — METOCLOPRAMIDE HCL 5 MG/ML IJ SOLN: 5.0000 mg | Freq: Three times a day (TID) | INTRAMUSCULAR | Status: DC | PRN

## 2019-07-15 MED ORDER — LIDOCAINE HCL (CARDIAC) PF 100 MG/5ML IV SOSY
PREFILLED_SYRINGE | INTRAVENOUS | Status: DC | PRN
  Administered 2019-07-15: 60 mg via INTRAVENOUS

## 2019-07-15 MED ORDER — PROPOFOL 10 MG/ML IV BOLUS
INTRAVENOUS | Status: AC
  Filled 2019-07-15: qty 20

## 2019-07-15 MED ORDER — PROPOFOL 10 MG/ML IV BOLUS
INTRAVENOUS | Status: DC | PRN
  Administered 2019-07-15: 140 mg via INTRAVENOUS

## 2019-07-15 MED ORDER — POTASSIUM CHLORIDE IN NACL 20-0.9 MEQ/L-% IV SOLN: INTRAVENOUS | Status: DC

## 2019-07-15 MED ORDER — FENTANYL CITRATE (PF) 100 MCG/2ML IJ SOLN
INTRAMUSCULAR | Status: AC
  Filled 2019-07-15: qty 2

## 2019-07-15 MED ORDER — MIDAZOLAM HCL 2 MG/2ML IJ SOLN
INTRAMUSCULAR | Status: AC
  Filled 2019-07-15: qty 2

## 2019-07-15 MED ORDER — BUPIVACAINE-EPINEPHRINE (PF) 0.5% -1:200000 IJ SOLN
INTRAMUSCULAR | Status: DC | PRN
  Administered 2019-07-15: 50 mL via PERINEURAL

## 2019-07-15 MED ORDER — EPHEDRINE SULFATE 50 MG/ML IJ SOLN
INTRAMUSCULAR | Status: DC | PRN
  Administered 2019-07-15 (×4): 10 mg via INTRAVENOUS

## 2019-07-15 MED ORDER — HYDROCODONE-ACETAMINOPHEN 5-325 MG PO TABS: 1.0000 | ORAL_TABLET | Freq: Four times a day (QID) | ORAL | 0 refills | Status: DC | PRN

## 2019-07-15 SURGICAL SUPPLY — 40 items
"PENCIL ELECTRO HAND CTR " (MISCELLANEOUS) ×1 IMPLANT
BAG COUNTER SPONGE EZ (MISCELLANEOUS) IMPLANT
BLADE FULL RADIUS 3.5 (BLADE) ×2 IMPLANT
BLADE SHAVER 4.5X7 STR FR (MISCELLANEOUS) ×2 IMPLANT
BNDG ELASTIC 6X5.8 VLCR STR LF (GAUZE/BANDAGES/DRESSINGS) ×3 IMPLANT
CHLORAPREP W/TINT 26 (MISCELLANEOUS) ×3 IMPLANT
COUNTER SPONGE BAG EZ (MISCELLANEOUS)
COVER WAND RF STERILE (DRAPES) ×3 IMPLANT
CUFF TOURN SGL QUICK 24 (TOURNIQUET CUFF)
CUFF TOURN SGL QUICK 30 (TOURNIQUET CUFF) ×2
CUFF TRNQT CYL 24X4X16.5-23 (TOURNIQUET CUFF) IMPLANT
CUFF TRNQT CYL 30X4X21-28X (TOURNIQUET CUFF) IMPLANT
DRAPE IMP U-DRAPE 54X76 (DRAPES) ×4 IMPLANT
ELECT REM PT RETURN 9FT ADLT (ELECTROSURGICAL) ×3
ELECTRODE REM PT RTRN 9FT ADLT (ELECTROSURGICAL) ×1 IMPLANT
GAUZE SPONGE 4X4 12PLY STRL (GAUZE/BANDAGES/DRESSINGS) ×3 IMPLANT
GLOVE BIO SURGEON STRL SZ8 (GLOVE) ×6 IMPLANT
GLOVE BIOGEL M 7.0 STRL (GLOVE) ×6 IMPLANT
GLOVE BIOGEL PI IND STRL 7.5 (GLOVE) ×1 IMPLANT
GLOVE BIOGEL PI INDICATOR 7.5 (GLOVE) ×2
GLOVE INDICATOR 8.0 STRL GRN (GLOVE) ×3 IMPLANT
GOWN STRL REUS W/ TWL LRG LVL3 (GOWN DISPOSABLE) ×1 IMPLANT
GOWN STRL REUS W/ TWL XL LVL3 (GOWN DISPOSABLE) ×2 IMPLANT
GOWN STRL REUS W/TWL LRG LVL3 (GOWN DISPOSABLE) ×2
GOWN STRL REUS W/TWL XL LVL3 (GOWN DISPOSABLE) ×4
IV LACTATED RINGER IRRG 3000ML (IV SOLUTION) ×2
IV LR IRRIG 3000ML ARTHROMATIC (IV SOLUTION) ×1 IMPLANT
KIT TURNOVER KIT A (KITS) ×3 IMPLANT
MANIFOLD NEPTUNE II (INSTRUMENTS) ×3 IMPLANT
NDL HYPO 21X1.5 SAFETY (NEEDLE) ×1 IMPLANT
NEEDLE HYPO 21X1.5 SAFETY (NEEDLE) ×3 IMPLANT
PACK ARTHROSCOPY KNEE (MISCELLANEOUS) ×3 IMPLANT
PENCIL ELECTRO HAND CTR (MISCELLANEOUS) ×3 IMPLANT
SCALPEL PROTECTED #11 DISP (BLADE) ×2 IMPLANT
STOCKINETTE M/LG 89821 (MISCELLANEOUS) ×2 IMPLANT
SUT PROLENE 4 0 PS 2 18 (SUTURE) ×3 IMPLANT
SUT TICRON COATED BLUE 2 0 30 (SUTURE) IMPLANT
SYR 50ML LL SCALE MARK (SYRINGE) ×3 IMPLANT
TUBING ARTHRO INFLOW-ONLY STRL (TUBING) ×3 IMPLANT
WAND WEREWOLF FLOW 90D (MISCELLANEOUS) ×3 IMPLANT

## 2019-07-15 NOTE — Discharge Instructions (Addendum)
Orthopedic discharge instructions: Keep dressing dry and intact.  May shower after dressing changed on post-op day #4 (Saturday).  Cover sutures with Band-Aids after drying off. Apply ice frequently to knee. Take Aleve 2 tabs BID with meals for 7-10 days, then as necessary. Take pain medication as prescribed or ES Tylenol when needed.  May weight-bear as tolerated - use crutches or walker as needed. Follow-up in 10-14 days or as scheduled.  AMBULATORY SURGERY  DISCHARGE INSTRUCTIONS   1) The drugs that you were given will stay in your system until tomorrow so for the next 24 hours you should not:  A) Drive an automobile B) Make any legal decisions C) Drink any alcoholic beverage   2) You may resume regular meals tomorrow.  Today it is better to start with liquids and gradually work up to solid foods.  You may eat anything you prefer, but it is better to start with liquids, then soup and crackers, and gradually work up to solid foods.   3) Please notify your doctor immediately if you have any unusual bleeding, trouble breathing, redness and pain at the surgery site, drainage, fever, or pain not relieved by medication.    4) Additional Instructions:        Please contact your physician with any problems or Same Day Surgery at [phone], Monday through Friday 6 am to 4 pm, or [place] at [facility] number at [phone].

## 2019-07-15 NOTE — H&P (Signed)
Paper H&P to be scanned into permanent record. H&P reviewed and patient re-examined. No changes. 

## 2019-07-15 NOTE — Anesthesia Procedure Notes (Signed)
Procedure Name: Intubation Date/Time: 07/15/2019 9:57 AM Performed by: Gentry Fitz, CRNA Pre-anesthesia Checklist: Patient identified, Emergency Drugs available, Suction available and Patient being monitored Patient Re-evaluated:Patient Re-evaluated prior to induction Oxygen Delivery Method: Circle system utilized Preoxygenation: Pre-oxygenation with 100% oxygen Induction Type: IV induction and Cricoid Pressure applied Ventilation: Mask ventilation without difficulty Laryngoscope Size: Mac and 4 Grade View: Grade I Tube type: Oral Tube size: 7.0 mm Number of attempts: 1 Airway Equipment and Method: Stylet Placement Confirmation: ETT inserted through vocal cords under direct vision,  positive ETCO2 and breath sounds checked- equal and bilateral Secured at: 22 cm Tube secured with: Tape

## 2019-07-15 NOTE — Op Note (Signed)
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    Postoperative diagnosis:   Degenerative joint disease with symptomatic medial shelf plica and medial and lateral meniscal tears, left knee.  Procedure:   Arthroscopic debridement with partial medial and lateral meniscectomies, excision of symptomatic suprapatella plica, and abrasion chondroplasty of grade 3 chondromalacial changes of medial femoral condyle, left knee.  Surgeon:   Pascal Lux, M.D.  Anesthesia:   GET  Findings:   As above.  There was a degenerative tear involving the posterior portion of the medial meniscus as well as a more complex tear involving the anterior and anterolateral portions of the lateral meniscus.  There were several areas of grade 3 chondromalacial changes involving the medial femoral condyle, including both the weightbearing portion of the condyle as well as along the medial edge, consistent with a "kissing lesion".  The central ridge of the patella also demonstrated grade 3 chondromalacial changes.  There were more diffuse grade 1-2 chondromalacial changes involving the medial and lateral tibial plateaus, as well as the lateral femoral condyle.  The anterior posterior cruciate ligaments both were in satisfactory condition, although the ACL did demonstrate some disruption/attenuation of the anteriormost fibers which should not affect its integrity or function.  Complications:   None.  EBL:   5 cc.  Total fluids:   800 cc of crystalloid.  Tourniquet time:   None  Drains:   None  Closure:   4-0 Prolene interrupted sutures.  Brief clinical note:   The patient is a 69 year old female with a 1 year history of left knee pain. The symptoms have persisted despite medications, activity modification, a steroid injection, etc. Her history and examination are consistent with meniscal tearing and degenerative joint  disease, both of which were confirmed by MRI scan. The patient presents at this time for arthroscopy, debridement, and repair versus partial medial and lateral meniscectomies.  Procedure:   The patient was brought into the operating room and lain in the supine position. After adequate general endotracheal intubation and anesthesia was obtained, a timeout was performed to verify the appropriate side. The patient's left knee was injected sterilely using a solution of 30 cc of 1% lidocaine and 30 cc of 0.5% Sensorcaine with epinephrine. The left lower extremity was prepped with ChloraPrep solution before being draped sterilely. Preoperative antibiotics were administered. The expected portal sites were injected with 0.5% Sensorcaine with epinephrine before the camera was placed in the anterolateral portal and instrumentation performed through the anteromedial portal.   The knee was sequentially examined beginning in the suprapatellar pouch, then progressing to the patellofemoral space, the medial gutter and compartment, the notch, and finally the lateral compartment and gutter. The findings were as described above. Abundant reactive synovial tissues anteriorly were debrided using the full-radius resector in order to improve visualization. This debridement also exposed a large thickened suprapatellar plica which appeared to be abrading the medial edge of the medial femoral condyle. This plica was debrided back to stable margins using the full-radius resector.   Probing of the posterior portion of the medial meniscus demonstrated some degenerative tearing involving the central most portion of the meniscus in this area.  This area was debrided back to stable margins using a combination of the full-radius resector and small baskets.  Subsequent probing of the remaining rim demonstrated good stability.  In addition, the areas of grade 3 chondromalacial changes  described above were debrided back to stable margins using  the full-radius resector.  Laterally, there was a more complex tear involving the anterior and anterolateral portions of the lateral meniscus.  These areas also were debrided back to stable margins using the full-radius resector.  The instruments were removed from the joint after suctioning the excess fluid.   The portal sites were closed using 4-0 Prolene interrupted sutures before a sterile bulky dressing was applied to the knee. The patient was then awakened, extubated, and returned to the recovery room in satisfactory condition after tolerating the procedure well.

## 2019-07-15 NOTE — Progress Notes (Signed)
Handoff report given to Myles Lipps RN @ 12:12 pm to relieve me for lunch.

## 2019-07-15 NOTE — Transfer of Care (Signed)
Immediate Anesthesia Transfer of Care Note  Patient: Kimberly Woodward  Procedure(s) Performed: KNEE ARTHROSCOPY WITH DEBRIDEMENT AND REPAIR VERSUS PATIAL MEDIAL AND LATERAL MENISECTOMY (Left Knee)  Patient Location: PACU  Anesthesia Type:General  Level of Consciousness: awake, drowsy and patient cooperative  Airway & Oxygen Therapy: Patient Spontanous Breathing and Patient connected to face mask oxygen  Post-op Assessment: Report given to RN and Post -op Vital signs reviewed and stable  Post vital signs: Reviewed and stable  Last Vitals:  Vitals Value Taken Time  BP    Temp    Pulse 84 07/15/19 1059  Resp 12 07/15/19 1059  SpO2 97 % 07/15/19 1059  Vitals shown include unvalidated device data.  Last Pain:  Vitals:   07/15/19 0830  TempSrc: Oral  PainSc: 8           Complications: none

## 2019-07-15 NOTE — Anesthesia Postprocedure Evaluation (Signed)
Anesthesia Post Note  Patient: Kimberly Woodward  Procedure(s) Performed: KNEE ARTHROSCOPY WITH DEBRIDEMENT AND REPAIR VERSUS PATIAL MEDIAL AND LATERAL MENISECTOMY (Left Knee)  Patient location during evaluation: PACU Anesthesia Type: General Level of consciousness: awake and alert Pain management: pain level controlled Vital Signs Assessment: post-procedure vital signs reviewed and stable Respiratory status: spontaneous breathing and respiratory function stable Cardiovascular status: stable Anesthetic complications: no     Last Vitals:  Vitals:   07/15/19 1113 07/15/19 1115  BP: (!) 111/51   Pulse: 87 86  Resp: 15 20  Temp:    SpO2: 94% 91%    Last Pain:  Vitals:   07/15/19 1115  TempSrc:   PainSc: 0-No pain                 Elienai Gailey K

## 2019-07-15 NOTE — Anesthesia Preprocedure Evaluation (Signed)
Anesthesia Evaluation  Patient identified by MRN, date of birth, ID band Patient awake    Reviewed: Allergy & Precautions, NPO status , Patient's Chart, lab work & pertinent test results  History of Anesthesia Complications Negative for: history of anesthetic complications  Airway Mallampati: II       Dental   Pulmonary COPD,  COPD inhaler, former smoker,           Cardiovascular hypertension, Pt. on medications (-) Past MI and (-) CHF + dysrhythmias (-) Valvular Problems/Murmurs     Neuro/Psych neg Seizures    GI/Hepatic Neg liver ROS, GERD  Poorly Controlled,  Endo/Other  diabetes (borderline)  Renal/GU      Musculoskeletal   Abdominal   Peds  Hematology   Anesthesia Other Findings   Reproductive/Obstetrics                             Anesthesia Physical Anesthesia Plan  ASA: III  Anesthesia Plan: General   Post-op Pain Management:    Induction: Intravenous  PONV Risk Score and Plan: 3 and Ondansetron and Treatment may vary due to age or medical condition  Airway Management Planned: Oral ETT  Additional Equipment:   Intra-op Plan:   Post-operative Plan:   Informed Consent: I have reviewed the patients History and Physical, chart, labs and discussed the procedure including the risks, benefits and alternatives for the proposed anesthesia with the patient or authorized representative who has indicated his/her understanding and acceptance.       Plan Discussed with:   Anesthesia Plan Comments:         Anesthesia Quick Evaluation

## 2019-12-17 ENCOUNTER — Other Ambulatory Visit: Payer: Self-pay | Admitting: Family Medicine

## 2019-12-17 DIAGNOSIS — Z1231 Encounter for screening mammogram for malignant neoplasm of breast: Secondary | ICD-10-CM

## 2019-12-22 ENCOUNTER — Ambulatory Visit
Admission: RE | Admit: 2019-12-22 | Discharge: 2019-12-22 | Disposition: A | Discharge status: Home / Self Care | Payer: Medicare HMO | Source: Ambulatory Visit | Attending: Family Medicine | Admitting: Family Medicine

## 2019-12-22 ENCOUNTER — Other Ambulatory Visit: Payer: Self-pay

## 2019-12-22 DIAGNOSIS — Z1231 Encounter for screening mammogram for malignant neoplasm of breast: Secondary | ICD-10-CM | POA: Diagnosis not present

## 2020-01-01 ENCOUNTER — Other Ambulatory Visit: Payer: Self-pay | Admitting: Surgery

## 2020-01-01 DIAGNOSIS — S83232D Complex tear of medial meniscus, current injury, left knee, subsequent encounter: Secondary | ICD-10-CM

## 2020-01-01 DIAGNOSIS — M1712 Unilateral primary osteoarthritis, left knee: Secondary | ICD-10-CM

## 2020-01-19 ENCOUNTER — Ambulatory Visit
Admission: RE | Admit: 2020-01-19 | Discharge: 2020-01-19 | Disposition: A | Discharge status: Home / Self Care | Payer: Medicare HMO | Source: Ambulatory Visit | Attending: Surgery | Admitting: Surgery

## 2020-01-19 ENCOUNTER — Other Ambulatory Visit: Payer: Self-pay

## 2020-01-19 DIAGNOSIS — S83232D Complex tear of medial meniscus, current injury, left knee, subsequent encounter: Secondary | ICD-10-CM | POA: Insufficient documentation

## 2020-01-19 DIAGNOSIS — M1712 Unilateral primary osteoarthritis, left knee: Secondary | ICD-10-CM | POA: Diagnosis not present

## 2020-01-23 ENCOUNTER — Other Ambulatory Visit
Admission: RE | Admit: 2020-01-23 | Discharge: 2020-01-23 | Disposition: A | Discharge status: Home / Self Care | Payer: Medicare HMO | Source: Ambulatory Visit | Attending: Surgery | Admitting: Surgery

## 2020-01-23 DIAGNOSIS — M1712 Unilateral primary osteoarthritis, left knee: Secondary | ICD-10-CM | POA: Diagnosis present

## 2020-01-23 DIAGNOSIS — S83232S Complex tear of medial meniscus, current injury, left knee, sequela: Secondary | ICD-10-CM | POA: Insufficient documentation

## 2020-01-23 DIAGNOSIS — M6752 Plica syndrome, left knee: Secondary | ICD-10-CM | POA: Insufficient documentation

## 2020-01-23 LAB — SYNOVIAL CELL COUNT + DIFF, W/ CRYSTALS
Eosinophils-Synovial: 0 %
Lymphocytes-Synovial Fld: 21 %
Monocyte-Macrophage-Synovial Fluid: 76 %
Neutrophil, Synovial: 3 %
WBC, Synovial: 191 /mm3 (ref 0–200)

## 2020-07-19 ENCOUNTER — Other Ambulatory Visit: Payer: Self-pay | Admitting: Surgery

## 2020-07-26 ENCOUNTER — Other Ambulatory Visit
Admission: RE | Admit: 2020-07-26 | Discharge: 2020-07-26 | Disposition: A | Discharge status: Home / Self Care | Payer: Medicare HMO | Source: Ambulatory Visit | Attending: Surgery | Admitting: Surgery

## 2020-07-26 ENCOUNTER — Other Ambulatory Visit: Payer: Self-pay

## 2020-07-26 DIAGNOSIS — Z01810 Encounter for preprocedural cardiovascular examination: Secondary | ICD-10-CM | POA: Diagnosis not present

## 2020-07-26 DIAGNOSIS — Z01818 Encounter for other preprocedural examination: Secondary | ICD-10-CM | POA: Diagnosis present

## 2020-07-26 LAB — CBC WITH DIFFERENTIAL/PLATELET
Abs Immature Granulocytes: 0.03 10*3/uL (ref 0.00–0.07)
Basophils Absolute: 0 10*3/uL (ref 0.0–0.1)
Basophils Relative: 0 %
Eosinophils Absolute: 0.1 10*3/uL (ref 0.0–0.5)
Eosinophils Relative: 2 %
HCT: 36.9 % (ref 36.0–46.0)
Hemoglobin: 12.3 g/dL (ref 12.0–15.0)
Immature Granulocytes: 0 %
Lymphocytes Relative: 18 %
Lymphs Abs: 1.2 10*3/uL (ref 0.7–4.0)
MCH: 27.5 pg (ref 26.0–34.0)
MCHC: 33.3 g/dL (ref 30.0–36.0)
MCV: 82.4 fL (ref 80.0–100.0)
Monocytes Absolute: 0.8 10*3/uL (ref 0.1–1.0)
Monocytes Relative: 13 %
Neutro Abs: 4.5 10*3/uL (ref 1.7–7.7)
Neutrophils Relative %: 67 %
Platelets: 309 10*3/uL (ref 150–400)
RBC: 4.48 MIL/uL (ref 3.87–5.11)
RDW: 13 % (ref 11.5–15.5)
WBC: 6.7 10*3/uL (ref 4.0–10.5)
nRBC: 0 % (ref 0.0–0.2)

## 2020-07-26 LAB — URINALYSIS, ROUTINE W REFLEX MICROSCOPIC
Bilirubin Urine: NEGATIVE
Glucose, UA: NEGATIVE mg/dL
Hgb urine dipstick: NEGATIVE
Ketones, ur: NEGATIVE mg/dL
Nitrite: NEGATIVE
Protein, ur: NEGATIVE mg/dL
Specific Gravity, Urine: 1.013 (ref 1.005–1.030)
pH: 5 (ref 5.0–8.0)

## 2020-07-26 LAB — TYPE AND SCREEN
ABO/RH(D): A POS
Antibody Screen: NEGATIVE

## 2020-07-26 LAB — SURGICAL PCR SCREEN
MRSA, PCR: NEGATIVE
Staphylococcus aureus: NEGATIVE

## 2020-07-26 LAB — COMPREHENSIVE METABOLIC PANEL
ALT: 19 U/L (ref 0–44)
AST: 23 U/L (ref 15–41)
Albumin: 4.1 g/dL (ref 3.5–5.0)
Alkaline Phosphatase: 124 U/L (ref 38–126)
Anion gap: 9 (ref 5–15)
BUN: 16 mg/dL (ref 8–23)
CO2: 31 mmol/L (ref 22–32)
Calcium: 9.8 mg/dL (ref 8.9–10.3)
Chloride: 82 mmol/L — ABNORMAL LOW (ref 98–111)
Creatinine, Ser: 0.94 mg/dL (ref 0.44–1.00)
GFR, Estimated: >60 mL/min (ref >60)
Glucose, Bld: 112 mg/dL — ABNORMAL HIGH (ref 70–99)
Potassium: 3.4 mmol/L — ABNORMAL LOW (ref 3.5–5.1)
Sodium: 122 mmol/L — ABNORMAL LOW (ref 135–145)
Total Bilirubin: 0.7 mg/dL (ref 0.3–1.2)
Total Protein: 8.1 g/dL (ref 6.5–8.1)

## 2020-07-26 NOTE — Patient Instructions (Addendum)
Your procedure is scheduled on:  Tuesday 08/03/20.  Report to THE FIRST FLOOR REGISTRATION DESK IN THE MEDICAL MALL ON THE MORNING OF SURGERY FIRST, THEN YOU WILL CHECK IN AT THE SURGERY INFORMATION DESK LOCATED OUTSIDE THE SAME DAY SURGERY DEPARTMENT LOCATED ON 2ND FLOOR MEDICAL MALL ENTRANCE.  To find out your arrival time please call [phone] between 1PM - 3PM on Monday 08/02/20.   Remember: Instructions that are not followed completely may result in serious medical risk, up to and including death, or upon the discretion of your surgeon and anesthesiologist your surgery may need to be rescheduled.     __X__ 1. Do not eat food after midnight the night before your procedure.                 No gum chewing or hard candies. You may drink clear liquids up to 2 hours                 before you are scheduled to arrive for your surgery- DO NOT drink clear                 liquids within 2 hours of the start of your surgery.                 Clear Liquids include:  water, apple juice without pulp, clear carbohydrate                 drink such as Clearfast or Gatorade, Black Coffee or Tea (Do not add                 milk or creamer to coffee or tea).  __X__2.  On the morning of surgery brush your teeth with toothpaste and water, you may rinse your mouth with mouthwash if you wish.  Do not swallow any toothpaste or mouthwash.    __X__ 3.  No Alcohol for 24 hours before or after surgery.  __X__ 4.  Do Not Smoke or use e-cigarettes For 24 Hours Prior to Your Surgery.                 Do not use any chewable tobacco products for at least 6 hours prior to                 surgery.  __X__5.  Notify your doctor if there is any change in your medical condition      (cold, fever, infections).      Do NOT wear jewelry, make-up, hairpins, clips or nail polish. Do NOT wear lotions, powders, or perfumes.  Do NOT shave 48 hours prior to surgery. Men may shave face and neck. Do NOT bring valuables to the  hospital.     [facility] is not responsible for any belongings or valuables.   Contacts, dentures/partials or body piercings may not be worn into surgery. Bring a case for your contacts, glasses or hearing aids, a denture cup will be supplied.  Leave your suitcase in the car. After surgery it may be brought to your room.   For patients admitted to the hospital, discharge time is determined by your treatment team.    __X__ Take these medicines the morning of surgery with A SIP OF WATER:     1. albuterol (VENTOLIN HFA)  2. (BREZTRI AEROSPHERE) Inhaler  3. ipratropium (ATROVENT)  4. amLODipine (NORVASC)  5. carvedilol (COREG)   6. esomeprazole (NEXIUM)   7. tamsulosin (FLOMAX)      __X__ Use CHG  Soap as directed.  __X__ Use inhalers on the day of surgery. Also bring the inhaler with you to the hospital on the morning of surgery.   __X__ Stop Blood Thinners: Plavix & Aspirin. Check with Dr. Adriana Mccallum for when you can safely stop these medications before surgery.  __X__ Stop Anti-inflammatories 7 days before surgery such as Advil, Ibuprofen, Motrin, BC or Goodies Powder, Naprosyn, Naproxen, Aleve, Aspirin, Meloxicam. May take Tylenol if needed for pain or discomfort.   __X__Do not start taking any new herbal supplements or vitamins prior to your procedure.    Wear comfortable clothing (specific to your surgery type) to the hospital.  Plan for stool softeners for home use; pain medications have a tendency to cause constipation. You can also help prevent constipation by eating foods high in fiber such as fruits and vegetables and drinking plenty of fluids as your diet allows.  After surgery, you can prevent lung complications by doing breathing exercises.Take deep breaths and cough every 1-2 hours. Your doctor may order a device called an Incentive Spirometer to help you take deep breaths.  Please call the [facility] Department at [phone] if you have any  questions about these instructions.

## 2020-07-26 NOTE — Progress Notes (Addendum)
  [facility] Perioperative Services: Pre-Admission/Anesthesia Testing  Abnormal Lab Notification   Date: 07/26/20  Name: Kimberly Woodward MRN:   [id number]  Re: Abnormal labs noted during PAT appointment   Provider(s) Notified: Poggi, Marshall Cork, MD Notification mode: Routed and/or faxed via [facility] LAB VALUE(S): Lab Results  Component Value Date   NA 122 (L) 07/26/2020   CL 82 (L) 07/26/2020   Lab Results  Component Value Date   COLORURINE YELLOW (A) 07/26/2020   APPEARANCEUR HAZY (A) 07/26/2020   LABSPEC 1.013 07/26/2020   PHURINE 5.0 07/26/2020   GLUCOSEU NEGATIVE 07/26/2020   HGBUR NEGATIVE 07/26/2020   BILIRUBINUR NEGATIVE 07/26/2020   KETONESUR NEGATIVE 07/26/2020   PROTEINUR NEGATIVE 07/26/2020   NITRITE NEGATIVE 07/26/2020   LEUKOCYTESUR MODERATE (A) 07/26/2020   EPIU 0-5 07/26/2020   WBCU 6-10 07/26/2020   RBCU 0-5 07/26/2020   BACTERIA RARE (A) 07/26/2020    Notes:  Patient scheduled for TOTAL KNEE ARTHROPLASTY (Left Knee) on 08/03/2020  1. Sodium low at 122 mmol/L with a chloride of 82 mmol/L.  K+ is only slightly low at 3.4 mmol/L, however this is because patient is taking daily oral potassium supplement.   Mediation list reviewed. Patient on daily loop (furosemide) and thiazide (HCTZ) therapy.   Sending to Dr. Roland Rack for review and optimization.   Order placed to have Na level rechecked on the day of surgery to ensure optimization.   2. UA performed in PAT concerning for potential early infection (moderate LE, + bacteria, and 6-10 WBC/hpf).  . No leukocytosis noted on CBC; WBC 6.7 K/uL . Renal function normal. Estimated Creatinine Clearance: 58.8 mL/min (by C-G formula based on SCr of 0.94 mg/dL). . Urine C&S added to assess for pathogenically significant growth. . Will forward UA, and subsequent C&S results, to attending surgeon for review and Tx as deemed appropriate.   ADDENDUM: Discussed patient with interview PAT nurse.  It was determined that patient is indeed NOT taking furosemide at this point. She is however taking both HCTZ and triamterene, both of which are undoubtedly contributing to the HYPOnatremia noted on today's labs. Medication list updated to ensure correct clinical course reflected.   [name], MSN, APRN, FNP-C, CEN [facility]  Peri-operative Services Nurse Practitioner Phone: [phone] Fax: [phone] 07/26/20 1:07 PM

## 2020-07-27 ENCOUNTER — Encounter: Payer: Self-pay | Admitting: Surgery

## 2020-07-27 NOTE — Progress Notes (Addendum)
Perioperative Services  Pre-Admission/Anesthesia Testing Clinical Review  Date: 07/27/20  Patient Demographics:  Name: Kimberly Woodward DOB:   [birth year] MRN:   [id number]  Planned Surgical Procedure(s):    Case: [id number] Date/Time: 08/03/20 0715   Procedure: TOTAL KNEE ARTHROPLASTY (Left Knee)   Anesthesia type: Choice   Pre-op diagnosis:      Primary osteoarthritis of left knee M17.12     Type 2 diabetes mellitus without complication, unspecified whether long term insulin use  E11.9     Complex tear of medial meniscus of left knee as current injury, subsequent encounter S83.232D     Complex tear of lateral meniscus of left knee as current injury, subsequent encounter S83.272D   Location: [facility] OR ROOM 03 / [place] [facility]   Surgeons: Corky Mull, MD    NOTE: Available PAT nursing documentation and vital signs have been reviewed. Clinical nursing staff has updated patient's PMH/PSHx, current medication list, and drug allergies/intolerances to ensure comprehensive history available to assist in medical decision making as it pertains to the aforementioned surgical procedure and anticipated anesthetic course.   Clinical Discussion:  Kimberly Woodward is a 70 y.o. female who is submitted for pre-surgical anesthesia review and clearance prior to her undergoing the above procedure.Patient is a Former Smoker (25 pack years; quit 02/2015). Pertinent PMH includes: CAD, LBBB, PAD (subclavian artery stenosis s/p innominate artery PTA), HTN, HLD, prediabetes, GERD (on daily PPI), COPD, Addison's disease.  Patient is followed by cardiology Denman George, MD). She was last seen in the cardiology clinic on 06/17/2020; notes reviewed.  At the time of her clinic visit, patient complained of chest pain, shortness of breath, and palpitations.  She denied orthopnea, PND, peripheral edema, vertiginous symptoms, and presyncope/syncope.  PMH (+) for cardiovascular disease and intervention as follows:     Diagnostic heart catheterization performed on 03/01/2016 revealed no significant high-grade lesion except for the first diagonal whic, which was approximately 60 to 70% stenosed.  There was 10% stenosis of the proximal to distal RCA, in addition to a 10% stenosis of the ostial LAD to distal LAD.  Left ventricular systolic function and LVEDP was normal.   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  Of study was remainder insignificant.  Patient underwent innominate artery PTA procedure on 11/24/2017.   Myocardial perfusion imaging study performed in 09/24/2017 revealed no evidence of significant ischemia; EF 65%.   Long-term cardiac event monitor study performed on 10/09/2017 revealed a predominantly underlying sinus rhythm with a IVCD (LBBB).  There were 1143 supraventricular tachycardia episodes, with the fastest interval lasting 5 beats at a maximum heart rate of 193 bpm.  The longest episode lasted 19.6 seconds with an average rate of 122 bpm.   TTE performed on 03/24/2018 revealed mild LVH, normal left ventricular systolic function, aortic sclerosis, and a small pericardial effusion.   Repeat diagnostic heart catheterization performed on 09/17/2019 revealing significant one-vessel CAD.  There was 90% stenosis of the mid LAD/diagonal vessel distal to the bifurcation with a large first septal branch.  With normal left ventricular systolic function with an EF of 60% and LVEDP of 19 mmHg.  Lesion felt to be high risk requiring arthrectomy.  Orbital arthrectomy procedure performed on 09/26/2019 and a DES x1 was placed with excellent angiographic result resulting in TIMI-3 flow.  Patient prescribed DAPT therapy (ASA + clopidogrel) for at least 6 months, however cardiology notes that that therapy should  be longer.   TTE performed on 02/12/2020 revealed normal left ventricular systolic function with  an EF of >55%.  There was mild mitral valve regurgitation.  Patient on GDMT for her HTN and HLD diagnoses.  Blood pressure well controlled at 117/50 on currently prescribed CCB, beta-blocker, nitrate, and diuretic therapies.  Patient is on a statin for her HLD.  Patient with a prediabetes diagnosis; glucose reasonably controlled on currently prescribed regimen; Hgb A1c 6.3% when last checked on 07/01/2020. Functional capacity, as defined by DASI, is documented as being >/= 4 METS.  No changes were made to patient's medication regimen. Given patient's reports of intermittent chest pain, the decision was made to repeat diagnostic heart catheterization which she underwent on 06/25/2020, study revealed mild to moderate CAD including in the left circumflex and PDA stenosis.  The previously placed LAD/diagonal stent was patent.  LVEDP normal at 12 mmHg.  Normal left ventricular systolic function with an estimated EF of 60% (see full interpretation of cardiovascular testing below). Patient to follow-up with outpatient cardiology in 3 months or sooner if needed.  Patient is scheduled to undergo an elective orthopedic procedure on 08/03/2020 with Dr. Milagros Evener.  Given patient's past medical history significant for cardiovascular disease and intervention, presurgical cardiac clearance was sought by PAT team.  Per cardiology, "this patient is optimized for the planned procedure.  She should continue her beta-blocker dose throughout the perioperative period.  Patient may proceed with an overall ACCEPTABLE risk for perioperative cardiovascular complications".  Again, this patient is on daily DAPT therapy.  She has been instructed on recommendations from her cardiologist for holding both her ASA and clopidogrel for 7 days prior to her procedure with plans to resume 1 day postop or when bleeding risk felt to be minimized by the primary attending surgeon.  The patient is aware that her last dose of her DAPT medications will be  on 07/26/2020.    Patient denies previous perioperative complications with anesthesia in the past. In review of the available records, it is noted that patient underwent a general anesthetic course here (ASA III) in 07/2019 without documented complications.   Vitals with BMI 07/26/2020 07/15/2019 07/15/2019  Height 5\' 3"  - -  Weight 190 lbs - -  BMI 42.35 - -  Systolic 361 443 -  Diastolic 54 58 -  Pulse 73 89 81    Providers/Specialists:   NOTE: Primary physician provider listed below. Patient may have been seen by APP or partner within same practice.   PROVIDER ROLE / SPECIALTY LAST OV  Poggi, Marshall Cork, MD Orthopedics (Surgeon)  05/17/2020  Angelene [facility] Primary Care Provider  06/28/2020  Kerry Dory, MD Cardiology  06/25/2020   Allergies:  Ivp dye [iodinated diagnostic agents] and Penicillins  Current Home Medications:   No current facility-administered medications for this encounter.   Marland Kitchen albuterol (PROVENTIL) (2.5 MG/3ML) 0.083% nebulizer solution  . albuterol (VENTOLIN HFA) 108 (90 Base) MCG/ACT inhaler  . ALPRAZolam (XANAX) 0.25 MG tablet  . amLODipine (NORVASC) 10 MG tablet  . aspirin 81 MG chewable tablet  . atorvastatin (LIPITOR) 10 MG tablet  . Budeson-Glycopyrrol-Formoterol (BREZTRI AEROSPHERE) 160-9-4.8 MCG/ACT AERO  . carvedilol (COREG) 6.25 MG tablet  . cholecalciferol (VITAMIN D) 25 MCG (1000 UNIT) tablet  . clopidogrel (PLAVIX) 75 MG tablet  . diphenhydrAMINE (BENADRYL) 50 MG tablet  . DULoxetine (CYMBALTA) 30 MG capsule  . escitalopram (LEXAPRO) 10 MG tablet  . esomeprazole (NEXIUM) 40 MG capsule  . furosemide (LASIX) 20  MG tablet  . gabapentin (NEURONTIN) 300 MG capsule  . HYDROcodone-acetaminophen (NORCO) 5-325 MG tablet  . hydrocortisone (CORTEF) 10 MG tablet  . ipratropium (ATROVENT) 0.02 % nebulizer solution  . isosorbide mononitrate (IMDUR) 30 MG 24 hr tablet  . Multiple Vitamin (MULTIVITAMIN WITH MINERALS) TABS tablet  .  nitroGLYCERIN (NITROSTAT) 0.4 MG SL tablet  . potassium chloride (KLOR-CON) 10 MEQ tablet  . STIOLTO RESPIMAT 2.5-2.5 MCG/ACT AERS  . tamsulosin (FLOMAX) 0.4 MG CAPS capsule  . triamterene-hydrochlorothiazide (MAXZIDE-25) 37.5-25 MG tablet  . umeclidinium-vilanterol (ANORO ELLIPTA) 62.5-25 MCG/INH AEPB  . vitamin B-12 (CYANOCOBALAMIN) 1000 MCG tablet   History:   Past Medical History:  Diagnosis Date  . Colon polyps   . COPD (chronic obstructive pulmonary disease) ([place])   . Coronary artery disease   . GERD (gastroesophageal reflux disease)   . History of kidney stones   . Hyperlipidemia   . Hypertension   . LBBB (left bundle branch block)   . Pre-diabetes   . Subclavian arterial stenosis ([facility])    s/p innominate artery PTA   Past Surgical History:  Procedure Laterality Date  . ABDOMINAL HYSTERECTOMY    . BREAST BIOPSY Right    neg  . CARDIAC CATHETERIZATION Left 03/01/2016   Procedure: Left Heart Cath and Coronary Angiography;  Surgeon: Yolonda Kida, MD;  Location: [facility];  Service: Cardiovascular;  Laterality: Left;  . CHOLECYSTECTOMY    . HERNIA REPAIR    . KNEE ARTHROSCOPY WITH MEDIAL MENISECTOMY Left 07/15/2019   Procedure: KNEE ARTHROSCOPY WITH DEBRIDEMENT AND REPAIR VERSUS PATIAL MEDIAL AND LATERAL MENISECTOMY;  Surgeon: Corky Mull, MD;  Location: [facility] ORS;  Service: Orthopedics;  Laterality: Left;  . TONSILLECTOMY     Family History  Problem Relation Age of Onset  . Breast cancer Maternal Aunt   . Breast cancer Paternal Aunt    Social History   Tobacco Use  . Smoking status: Former Smoker    Packs/day: 0.50    Years: 50.00    Pack years: 25.00    Types: Cigarettes    Quit date: 03/02/2015    Years since quitting: 5.4  . Smokeless tobacco: Never Used  Vaping Use  . Vaping Use: Never used  Substance Use Topics  . Alcohol use: No  . Drug use: No    Pertinent Clinical Results:  LABS: Labs reviewed: Repeat   Hospital Outpatient Visit  on 07/26/2020  Component Date Value Ref Range Status  . MRSA, PCR 07/26/2020 NEGATIVE  NEGATIVE Final  . Staphylococcus aureus 07/26/2020 NEGATIVE  NEGATIVE Final   Comment: (NOTE) The Xpert SA Assay (FDA approved for NASAL specimens in patients 44 years of age and older), is one component of a comprehensive surveillance program. It is not intended to diagnose infection nor to guide or monitor treatment. Performed at [facility], [address] [place], [place] [address]   . WBC 07/26/2020 6.7  4.0 - 10.5 K/uL Final  . RBC 07/26/2020 4.48  3.87 - 5.11 MIL/uL Final  . Hemoglobin 07/26/2020 12.3  12.0 - 15.0 g/dL Final  . HCT 07/26/2020 36.9  36.0 - 46.0 % Final  . MCV 07/26/2020 82.4  80.0 - 100.0 fL Final  . MCH 07/26/2020 27.5  26.0 - 34.0 pg Final  . MCHC 07/26/2020 33.3  30.0 - 36.0 g/dL Final  . RDW 07/26/2020 13.0  11.5 - 15.5 % Final  . Platelets 07/26/2020 309  150 - 400 K/uL Final  . nRBC 07/26/2020 0.0  0.0 - 0.2 % Final  . Neutrophils Relative % 07/26/2020 67  % Final  . Neutro Abs 07/26/2020 4.5  1.7 - 7.7 K/uL Final  . Lymphocytes Relative 07/26/2020 18  % Final  . Lymphs Abs 07/26/2020 1.2  0.7 - 4.0 K/uL Final  . Monocytes Relative 07/26/2020 13  % Final  . Monocytes Absolute 07/26/2020 0.8  0.1 - 1.0 K/uL Final  . Eosinophils Relative 07/26/2020 2  % Final  . Eosinophils Absolute 07/26/2020 0.1  0.0 - 0.5 K/uL Final  . Basophils Relative 07/26/2020 0  % Final  . Basophils Absolute 07/26/2020 0.0  0.0 - 0.1 K/uL Final  . Immature Granulocytes 07/26/2020 0  % Final  . Abs Immature Granulocytes 07/26/2020 0.03  0.00 - 0.07 K/uL Final   Performed at [facility], [address], [place], [place] [address]  . Sodium 07/26/2020 122* 135 - 145 mmol/L Final  . Potassium 07/26/2020 3.4* 3.5 - 5.1 mmol/L Final  . Chloride 07/26/2020 82* 98 - 111 mmol/L Final  . CO2 07/26/2020 31  22 - 32 mmol/L Final  . Glucose, Bld 07/26/2020 112* 70 - 99 mg/dL Final    Glucose reference range applies only to samples taken after fasting for at least 8 hours.  . BUN 07/26/2020 16  8 - 23 mg/dL Final  . Creatinine, Ser 07/26/2020 0.94  0.44 - 1.00 mg/dL Final  . Calcium 07/26/2020 9.8  8.9 - 10.3 mg/dL Final  . Total Protein 07/26/2020 8.1  6.5 - 8.1 g/dL Final  . Albumin 07/26/2020 4.1  3.5 - 5.0 g/dL Final  . AST 07/26/2020 23  15 - 41 U/L Final  . ALT 07/26/2020 19  0 - 44 U/L Final  . Alkaline Phosphatase 07/26/2020 124  38 - 126 U/L Final  . Total Bilirubin 07/26/2020 0.7  0.3 - 1.2 mg/dL Final  . GFR, Estimated 07/26/2020 >60  >60 mL/min Final   Comment: (NOTE) Calculated using the CKD-EPI Creatinine Equation (2021)   . Anion gap 07/26/2020 9  5 - 15 Final   Performed at [facility], [address] [place], [place] [address]  . Color, Urine 07/26/2020 YELLOW* YELLOW Final  . APPearance 07/26/2020 HAZY* CLEAR Final  . Specific Gravity, Urine 07/26/2020 1.013  1.005 - 1.030 Final  . pH 07/26/2020 5.0  5.0 - 8.0 Final  . Glucose, UA 07/26/2020 NEGATIVE  NEGATIVE mg/dL Final  . Hgb urine dipstick 07/26/2020 NEGATIVE  NEGATIVE Final  . Bilirubin Urine 07/26/2020 NEGATIVE  NEGATIVE Final  . Ketones, ur 07/26/2020 NEGATIVE  NEGATIVE mg/dL Final  . Protein, ur 07/26/2020 NEGATIVE  NEGATIVE mg/dL Final  . Nitrite 07/26/2020 NEGATIVE  NEGATIVE Final  . Leukocytes,Ua 07/26/2020 MODERATE* NEGATIVE Final  . RBC / HPF 07/26/2020 0-5  0 - 5 RBC/hpf Final  . WBC, UA 07/26/2020 6-10  0 - 5 WBC/hpf Final  . Bacteria, UA 07/26/2020 RARE* NONE SEEN Final  . Squamous Epithelial / LPF 07/26/2020 0-5  0 - 5 Final  . Hyaline Casts, UA 07/26/2020 PRESENT   Final   Performed at [facility], [address], [place], [place] [address]  . ABO/RH(D) 07/26/2020 A POS   Final  . Antibody Screen 07/26/2020 NEG   Final  . Sample Expiration 07/26/2020 08/09/2020,2359   Final  . Extend sample reason 07/26/2020    Final                   Value:NO  TRANSFUSIONS OR PREGNANCY IN  THE PAST 3 MONTHS Performed at [facility], [address] [place], [place] [address]     ECG: Date: 07/26/2020 Time ECG obtained: 1026 AM Rate: 67 bpm Rhythm: normal sinus; LBBB Axis (leads I and aVF): Left Intervals: PR 196 ms. QRS 148 ms. QTc 454 ms. ST segment and T wave changes: No evidence of acute ST segment elevation or depression Comparison: Similar to previous tracing obtained on 05/22/2018.   IMAGING / PROCEDURES: LEFT HEART CATHETERIZATION AND CORONARY ANGIOGRAPHY performed on 06/25/2020 1. Mild to moderate coronary artery disease including the left circumflex and PDA stenosis 2. Patent LAD/diagonal stent 3. Normal LVEDP of 12 mmHg 4. Normal left ventricular systolic function with an estimated LVEF of 60% 5. Recommend aggressive secondary prevention and follow-up with primary cardiologist  ECHOCARDIOGRAM WITH COLOR-FLOW SPECTRAL DOPPLER performed on 02/12/2020 1. The left ventricle is normal in size with upper normal wall thickness. 2. The left ventricular systolic function is normal, LVEF is visually estimated at > 55%. 3. The mitral valve leaflets are mildly thickened with normal leaflet mobility.  4. There is mild mitral valve regurgitation. 5. The aortic valve is probably trileaflet with mildly thickened leaflets with normal excursion.  6. The right ventricle is normal in size, with normal systolic function.  7. IVC size and inspiratory change suggest mildly elevated right atrial pressure. (5-10 mmHg).    CARDIAC CATHETERIZATION/ARTHRECTOMY performed on 09/26/2019 1. There is significant one-vessel CAD including a calcified 90% LAD/diagonal stenosis 2. Successful orbital arthrectomy of the LAD/diagonal stenosis with placement of a 2.5 x 24 mm Synergy DES with excellent angiographic result and TIMI-3 flow 3. Recommend DAPT (ASA + clopidogrel) for at least 6 months, and ideally longer 4. Cardiac rehab referral was placed and  discussed with patient  LEFT HEART CATHETERIZATION AND CORONARY ANGIOGRAPHY performed on 09/17/2019 1. Significant one-vessel CAD  90% stenosis of the mid LAD/diagonal vessel distal to the bifurcation with a large first septal branch. 2. Normal left ventricular function; EF estimated 60% 3. LVEDP 19 mmHg 4. Recommend medical management of CKD risk factors  ASA 81 mg daily indefinitely  Clopidogrel 75 mg daily  Consider PCI of LAD/diagonal for refractory symptoms.  This is a high risk lesion and would likely require arthrectomy and bifurcation stenting.  BILATERAL CAROTID DOPPLER performed on 05/29/2019 1. Right carotid:  There is evidence in the ICA of a 40 to 59% stenosis  Nonhemodynamically significant plaque noted in the CCA  Plaque noted in the ECA 2. Left carotid:   There is evidence in the ICA of a <40% stenosis  Nonhemodynamically significant plaque noted in CCA  Plaque noted in the ECA 3. Vertebrals:  Both vertebral arteries were patent with antegrade flow  Early systolic deceleration on the right  Disturbed flow on the left 4. Subclavians:  Bilateral subclavian arteries were stenotic  LONG TERM CARDIAC EVENT MONITOR STUDY performed on 10/09/2017 1. Predominantly underlying sinus rhythm with a known bundle branch/IVCD present. 2. 1143 supraventricular tachycardia runs occurred   Fastest interval lasting 5 beats at a max rate of 193 bpm  Longest lasting 19.66 with average rate of 122 bpm 3. Isolated SVE's were occasional (1.7%) 4. SVE couplets were occasional (1.0%) 5. SVE triplets were rare (<1.0%) 6. Isolated VE's were rare (<1.0%) 7. No VE couplets or VE triplets were present  LEXISCAN performed on 10/04/2017 1. Global systolic function normal with an EF of >65% 2. No evidence of stress-induced myocardial ischemia 3. Occasional atrial and ventricular ectopy noted 4.  Baseline EKG normal sinus with LBBB 5. Baseline vitals: HR 74 and BP  162/64 6. Stress vitals: HR 97 and BP 164/62  LEFT HEART CATHETERIZATION AND CORONARY ANGIOGRAPHY performed on 03/01/2016 1. No significant high-grade lesion except in the first diagonal  First diagonal lesion 60% stenosed 2. Normal overall left ventricular systolic function 3. Mild to moderate diffuse calcification of the proximal vessels  Proximal RCA to distal RCA 10% stenosed  Ostial LAD to distal LAD 10% stenosis 4. Defer intervention recommend medical therapy  Impression and Plan:  Kimberly Woodward has been referred for pre-anesthesia review and clearance prior to her undergoing the planned anesthetic and procedural courses. Available labs, pertinent testing, and imaging results were personally reviewed by me. This patient has been appropriately cleared by cardiology with an overall ACCEPTABLE risk of significant perioperative cardiovascular complications.  Patient with abnormalities noted on today's labs.  Patient with a mild hypokalemia; K+ 3.4.  Patient significantly hyponatremic at 122 with a chloride of 82.  Of note, patient is on daily diuretic therapy.  Additionally she has known Addison's disease, which could account for the electrolyte derangements seen on today's labs.  Additionally, patient with UA consistent with infection.  Subsequent culture showing >100,000 CFU/mL gram (-) rods; pathogen ID and sensitivities pending.  Aforementioned labs have been sent to primary attending surgeon for review and optimization/treatment.  With that being said, based on clinical review performed today (07/27/20), barring any significant acute changes in the patient's overall condition, it is anticipated that she will be able to proceed with the planned surgical intervention. Any acute changes in clinical condition may necessitate her procedure being postponed and/or cancelled. Pre-surgical instructions were reviewed with the patient during her PAT appointment and questions were fielded by PAT  clinical staff.  [name], MSN, APRN, FNP-C, CEN [facility]  Peri-operative Services Nurse Practitioner Phone: [phone] 07/27/20 1:04 PM  NOTE: This note has been prepared using Dragon dictation software. Despite my best ability to proofread, there is always the potential that unintentional transcriptional errors may still occur from this process.

## 2020-07-28 LAB — URINE CULTURE: Culture: >=100000 — AB

## 2020-07-30 ENCOUNTER — Other Ambulatory Visit: Payer: Self-pay

## 2020-07-30 ENCOUNTER — Other Ambulatory Visit
Admission: RE | Admit: 2020-07-30 | Discharge: 2020-07-30 | Disposition: A | Discharge status: Home / Self Care | Payer: Medicare HMO | Source: Ambulatory Visit | Attending: Surgery | Admitting: Surgery

## 2020-07-30 DIAGNOSIS — Z20822 Contact with and (suspected) exposure to covid-19: Secondary | ICD-10-CM | POA: Diagnosis not present

## 2020-07-30 DIAGNOSIS — Z01812 Encounter for preprocedural laboratory examination: Secondary | ICD-10-CM | POA: Diagnosis present

## 2020-07-30 LAB — SARS CORONAVIRUS 2 (TAT 6-24 HRS): SARS Coronavirus 2: NEGATIVE

## 2020-08-02 MED ORDER — CLINDAMYCIN PHOSPHATE 900 MG/50ML IV SOLN
900.0000 mg | INTRAVENOUS | Status: AC
  Administered 2020-08-03: 900 mg via INTRAVENOUS

## 2020-08-02 MED ORDER — LACTATED RINGERS IV SOLN: INTRAVENOUS | Status: DC

## 2020-08-02 MED ORDER — CHLORHEXIDINE GLUCONATE 0.12 % MT SOLN: 15.0000 mL | Freq: Once | OROMUCOSAL | Status: DC

## 2020-08-02 MED ORDER — ORAL CARE MOUTH RINSE: 15.0000 mL | Freq: Once | OROMUCOSAL | Status: DC

## 2020-08-03 ENCOUNTER — Other Ambulatory Visit: Payer: Self-pay

## 2020-08-03 ENCOUNTER — Encounter
Admission: RE | Disposition: A | Discharge status: Skilled Nursing Facility | Payer: Self-pay | Source: Home / Self Care | Attending: Surgery

## 2020-08-03 ENCOUNTER — Inpatient Hospital Stay
Admission: RE | Admit: 2020-08-03 | Discharge: 2020-08-09 | DRG: 470 | Disposition: A | Discharge status: Skilled Nursing Facility | Payer: Medicare HMO | Attending: Surgery | Admitting: Surgery

## 2020-08-03 ENCOUNTER — Inpatient Hospital Stay: Payer: Medicare HMO

## 2020-08-03 ENCOUNTER — Ambulatory Visit: Payer: Medicare HMO | Admitting: Urgent Care

## 2020-08-03 ENCOUNTER — Encounter: Payer: Self-pay | Admitting: Surgery

## 2020-08-03 DIAGNOSIS — E876 Hypokalemia: Secondary | ICD-10-CM | POA: Diagnosis present

## 2020-08-03 DIAGNOSIS — Z800 Family history of malignant neoplasm of digestive organs: Secondary | ICD-10-CM

## 2020-08-03 DIAGNOSIS — J449 Chronic obstructive pulmonary disease, unspecified: Secondary | ICD-10-CM | POA: Diagnosis present

## 2020-08-03 DIAGNOSIS — Z808 Family history of malignant neoplasm of other organs or systems: Secondary | ICD-10-CM

## 2020-08-03 DIAGNOSIS — Z8249 Family history of ischemic heart disease and other diseases of the circulatory system: Secondary | ICD-10-CM

## 2020-08-03 DIAGNOSIS — Z96652 Presence of left artificial knee joint: Secondary | ICD-10-CM

## 2020-08-03 DIAGNOSIS — I2510 Atherosclerotic heart disease of native coronary artery without angina pectoris: Secondary | ICD-10-CM | POA: Diagnosis present

## 2020-08-03 DIAGNOSIS — Z7982 Long term (current) use of aspirin: Secondary | ICD-10-CM

## 2020-08-03 DIAGNOSIS — E271 Primary adrenocortical insufficiency: Secondary | ICD-10-CM | POA: Diagnosis present

## 2020-08-03 DIAGNOSIS — K219 Gastro-esophageal reflux disease without esophagitis: Secondary | ICD-10-CM | POA: Diagnosis present

## 2020-08-03 DIAGNOSIS — Z880 Allergy status to penicillin: Secondary | ICD-10-CM

## 2020-08-03 DIAGNOSIS — Z96651 Presence of right artificial knee joint: Secondary | ICD-10-CM

## 2020-08-03 DIAGNOSIS — Z91041 Radiographic dye allergy status: Secondary | ICD-10-CM

## 2020-08-03 DIAGNOSIS — I447 Left bundle-branch block, unspecified: Secondary | ICD-10-CM | POA: Diagnosis present

## 2020-08-03 DIAGNOSIS — Z20822 Contact with and (suspected) exposure to covid-19: Secondary | ICD-10-CM | POA: Diagnosis present

## 2020-08-03 DIAGNOSIS — Z833 Family history of diabetes mellitus: Secondary | ICD-10-CM

## 2020-08-03 DIAGNOSIS — Z823 Family history of stroke: Secondary | ICD-10-CM

## 2020-08-03 DIAGNOSIS — E119 Type 2 diabetes mellitus without complications: Secondary | ICD-10-CM | POA: Diagnosis present

## 2020-08-03 DIAGNOSIS — Z888 Allergy status to other drugs, medicaments and biological substances status: Secondary | ICD-10-CM

## 2020-08-03 DIAGNOSIS — E785 Hyperlipidemia, unspecified: Secondary | ICD-10-CM | POA: Diagnosis present

## 2020-08-03 DIAGNOSIS — Z87891 Personal history of nicotine dependence: Secondary | ICD-10-CM

## 2020-08-03 DIAGNOSIS — Z79899 Other long term (current) drug therapy: Secondary | ICD-10-CM

## 2020-08-03 DIAGNOSIS — M1712 Unilateral primary osteoarthritis, left knee: Principal | ICD-10-CM | POA: Diagnosis present

## 2020-08-03 DIAGNOSIS — Z803 Family history of malignant neoplasm of breast: Secondary | ICD-10-CM

## 2020-08-03 DIAGNOSIS — I959 Hypotension, unspecified: Secondary | ICD-10-CM | POA: Diagnosis not present

## 2020-08-03 DIAGNOSIS — Z7902 Long term (current) use of antithrombotics/antiplatelets: Secondary | ICD-10-CM

## 2020-08-03 DIAGNOSIS — E871 Hypo-osmolality and hyponatremia: Secondary | ICD-10-CM | POA: Diagnosis present

## 2020-08-03 HISTORY — DX: Left bundle-branch block, unspecified: I44.7

## 2020-08-03 HISTORY — PX: TOTAL KNEE ARTHROPLASTY: SHX125

## 2020-08-03 HISTORY — DX: Stricture of artery: I77.1

## 2020-08-03 LAB — POCT I-STAT, CHEM 8
BUN: 21 mg/dL (ref 8–23)
Calcium, Ion: 1.31 mmol/L (ref 1.15–1.40)
Chloride: 93 mmol/L — ABNORMAL LOW (ref 98–111)
Creatinine, Ser: 1 mg/dL (ref 0.44–1.00)
Glucose, Bld: 97 mg/dL (ref 70–99)
HCT: 38 % (ref 36.0–46.0)
Hemoglobin: 12.9 g/dL (ref 12.0–15.0)
Potassium: 4.3 mmol/L (ref 3.5–5.1)
Sodium: 134 mmol/L — ABNORMAL LOW (ref 135–145)
TCO2: 28 mmol/L (ref 22–32)

## 2020-08-03 LAB — ABO/RH: ABO/RH(D): A POS

## 2020-08-03 SURGERY — ARTHROPLASTY, KNEE, TOTAL
Anesthesia: Spinal | Site: Knee | Laterality: Left

## 2020-08-03 MED ORDER — SODIUM CHLORIDE 0.9 % IV SOLN
INTRAVENOUS | Status: DC | PRN
  Administered 2020-08-03: 60 mL

## 2020-08-03 MED ORDER — ALBUTEROL SULFATE (2.5 MG/3ML) 0.083% IN NEBU: 2.5000 mg | INHALATION_SOLUTION | Freq: Four times a day (QID) | RESPIRATORY_TRACT | Status: DC | PRN

## 2020-08-03 MED ORDER — PANTOPRAZOLE SODIUM 40 MG PO TBEC
40.0000 mg | DELAYED_RELEASE_TABLET | Freq: Every day | ORAL | Status: DC
  Administered 2020-08-04 – 2020-08-09 (×6): 40 mg via ORAL
  Filled 2020-08-03 (×6): qty 1

## 2020-08-03 MED ORDER — ONDANSETRON HCL 4 MG/2ML IJ SOLN
INTRAMUSCULAR | Status: AC
  Filled 2020-08-03: qty 2

## 2020-08-03 MED ORDER — ENOXAPARIN SODIUM 40 MG/0.4ML ~~LOC~~ SOLN
40.0000 mg | SUBCUTANEOUS | Status: DC
  Administered 2020-08-04 – 2020-08-09 (×6): 40 mg via SUBCUTANEOUS
  Filled 2020-08-03 (×6): qty 0.4

## 2020-08-03 MED ORDER — TRANEXAMIC ACID 1000 MG/10ML IV SOLN
INTRAVENOUS | Status: DC | PRN
  Administered 2020-08-03: 1000 mg via TOPICAL

## 2020-08-03 MED ORDER — TAMSULOSIN HCL 0.4 MG PO CAPS
0.4000 mg | ORAL_CAPSULE | Freq: Every day | ORAL | Status: DC
  Administered 2020-08-04 – 2020-08-09 (×6): 0.4 mg via ORAL
  Filled 2020-08-03 (×6): qty 1

## 2020-08-03 MED ORDER — GABAPENTIN 100 MG PO CAPS
300.0000 mg | ORAL_CAPSULE | Freq: Every day | ORAL | Status: DC
  Administered 2020-08-03 – 2020-08-08 (×6): 300 mg via ORAL
  Filled 2020-08-03 (×7): qty 1

## 2020-08-03 MED ORDER — BUPIVACAINE HCL (PF) 0.5 % IJ SOLN
INTRAMUSCULAR | Status: AC
  Filled 2020-08-03: qty 10

## 2020-08-03 MED ORDER — PROPOFOL 10 MG/ML IV BOLUS
INTRAVENOUS | Status: AC
  Filled 2020-08-03: qty 20

## 2020-08-03 MED ORDER — PROPOFOL 500 MG/50ML IV EMUL
INTRAVENOUS | Status: AC
  Filled 2020-08-03: qty 50

## 2020-08-03 MED ORDER — KETOROLAC TROMETHAMINE 15 MG/ML IJ SOLN
INTRAMUSCULAR | Status: AC
  Administered 2020-08-03: 15 mg via INTRAVENOUS
  Filled 2020-08-03: qty 1

## 2020-08-03 MED ORDER — AMLODIPINE BESYLATE 5 MG PO TABS
10.0000 mg | ORAL_TABLET | Freq: Every day | ORAL | Status: DC
  Administered 2020-08-06 – 2020-08-08 (×3): 10 mg via ORAL
  Filled 2020-08-03 (×3): qty 1

## 2020-08-03 MED ORDER — SODIUM CHLORIDE FLUSH 0.9 % IV SOLN
INTRAVENOUS | Status: AC
  Filled 2020-08-03: qty 10

## 2020-08-03 MED ORDER — DIPHENHYDRAMINE HCL 12.5 MG/5ML PO ELIX
12.5000 mg | ORAL_SOLUTION | ORAL | Status: DC | PRN
  Filled 2020-08-03: qty 10

## 2020-08-03 MED ORDER — SODIUM CHLORIDE 0.9 % IV SOLN
INTRAVENOUS | Status: DC | PRN
  Administered 2020-08-03: 25 ug/min via INTRAVENOUS

## 2020-08-03 MED ORDER — PROPOFOL 500 MG/50ML IV EMUL
INTRAVENOUS | Status: DC | PRN
  Administered 2020-08-03: 125 ug/kg/min via INTRAVENOUS

## 2020-08-03 MED ORDER — HYDROCORTISONE NA SUCCINATE PF 100 MG IJ SOLR
50.0000 mg | Freq: Once | INTRAMUSCULAR | Status: AC
  Administered 2020-08-03: 50 mg via INTRAVENOUS

## 2020-08-03 MED ORDER — SODIUM CHLORIDE 0.9 % IV SOLN: INTRAVENOUS | Status: DC

## 2020-08-03 MED ORDER — DULOXETINE HCL 30 MG PO CPEP
30.0000 mg | ORAL_CAPSULE | Freq: Every day | ORAL | Status: DC
  Administered 2020-08-04 – 2020-08-09 (×6): 30 mg via ORAL
  Filled 2020-08-03 (×7): qty 1

## 2020-08-03 MED ORDER — BUPIVACAINE-EPINEPHRINE (PF) 0.5% -1:200000 IJ SOLN
INTRAMUSCULAR | Status: DC | PRN
  Administered 2020-08-03: 30 mL

## 2020-08-03 MED ORDER — ACETAMINOPHEN 325 MG PO TABS
325.0000 mg | ORAL_TABLET | Freq: Four times a day (QID) | ORAL | Status: DC | PRN
  Administered 2020-08-05 (×3): 650 mg via ORAL
  Filled 2020-08-03 (×3): qty 2

## 2020-08-03 MED ORDER — FENTANYL CITRATE (PF) 100 MCG/2ML IJ SOLN
INTRAMUSCULAR | Status: DC | PRN
  Administered 2020-08-03: 50 ug via INTRAVENOUS

## 2020-08-03 MED ORDER — VITAMIN D3 25 MCG (1000 UNIT) PO TABS
1000.0000 [IU] | ORAL_TABLET | Freq: Every day | ORAL | Status: DC
  Administered 2020-08-03 – 2020-08-09 (×7): 1000 [IU] via ORAL
  Filled 2020-08-03 (×12): qty 1

## 2020-08-03 MED ORDER — ACETAMINOPHEN 500 MG PO TABS
500.0000 mg | ORAL_TABLET | Freq: Four times a day (QID) | ORAL | Status: AC
  Administered 2020-08-04 (×3): 500 mg via ORAL
  Filled 2020-08-03 (×4): qty 1

## 2020-08-03 MED ORDER — DOCUSATE SODIUM 100 MG PO CAPS
100.0000 mg | ORAL_CAPSULE | Freq: Two times a day (BID) | ORAL | Status: DC
  Administered 2020-08-03 – 2020-08-09 (×11): 100 mg via ORAL
  Filled 2020-08-03 (×12): qty 1

## 2020-08-03 MED ORDER — ONDANSETRON HCL 4 MG PO TABS
4.0000 mg | ORAL_TABLET | Freq: Four times a day (QID) | ORAL | Status: DC | PRN
  Administered 2020-08-03 – 2020-08-08 (×2): 4 mg via ORAL
  Filled 2020-08-03 (×2): qty 1

## 2020-08-03 MED ORDER — TRIAMTERENE-HCTZ 37.5-25 MG PO TABS
1.0000 | ORAL_TABLET | Freq: Every day | ORAL | Status: DC
  Administered 2020-08-03 – 2020-08-05 (×3): 1 via ORAL
  Filled 2020-08-03 (×3): qty 1

## 2020-08-03 MED ORDER — UMECLIDINIUM BROMIDE 62.5 MCG/INH IN AEPB
1.0000 | INHALATION_SPRAY | Freq: Every day | RESPIRATORY_TRACT | Status: DC
  Administered 2020-08-04 – 2020-08-09 (×5): 1 via RESPIRATORY_TRACT
  Filled 2020-08-03: qty 7

## 2020-08-03 MED ORDER — TRAMADOL HCL 50 MG PO TABS
50.0000 mg | ORAL_TABLET | Freq: Four times a day (QID) | ORAL | Status: DC | PRN
  Administered 2020-08-05 – 2020-08-08 (×5): 50 mg via ORAL
  Filled 2020-08-03 (×5): qty 1

## 2020-08-03 MED ORDER — ALPRAZOLAM 0.25 MG PO TABS
0.2500 mg | ORAL_TABLET | Freq: Every day | ORAL | Status: DC
  Administered 2020-08-03 – 2020-08-08 (×4): 0.25 mg via ORAL
  Filled 2020-08-03 (×5): qty 1

## 2020-08-03 MED ORDER — ACETAMINOPHEN 10 MG/ML IV SOLN
INTRAVENOUS | Status: DC | PRN
  Administered 2020-08-03: 1000 mg via INTRAVENOUS

## 2020-08-03 MED ORDER — POTASSIUM CHLORIDE CRYS ER 20 MEQ PO TBCR
20.0000 meq | EXTENDED_RELEASE_TABLET | Freq: Every day | ORAL | Status: DC
  Administered 2020-08-04: 20 meq via ORAL
  Filled 2020-08-03: qty 1

## 2020-08-03 MED ORDER — ONDANSETRON HCL 4 MG/2ML IJ SOLN
INTRAMUSCULAR | Status: DC | PRN
  Administered 2020-08-03: 4 mg via INTRAVENOUS

## 2020-08-03 MED ORDER — ADULT MULTIVITAMIN W/MINERALS CH
1.0000 | ORAL_TABLET | Freq: Every day | ORAL | Status: DC
  Administered 2020-08-04 – 2020-08-09 (×6): 1 via ORAL
  Filled 2020-08-03 (×6): qty 1

## 2020-08-03 MED ORDER — FLEET ENEMA 7-19 GM/118ML RE ENEM: 1.0000 | ENEMA | Freq: Once | RECTAL | Status: DC | PRN

## 2020-08-03 MED ORDER — CLINDAMYCIN PHOSPHATE 600 MG/50ML IV SOLN
600.0000 mg | Freq: Four times a day (QID) | INTRAVENOUS | Status: AC
  Administered 2020-08-03 – 2020-08-04 (×3): 600 mg via INTRAVENOUS
  Filled 2020-08-03 (×3): qty 50

## 2020-08-03 MED ORDER — ASPIRIN 81 MG PO CHEW
81.0000 mg | CHEWABLE_TABLET | Freq: Every day | ORAL | Status: DC
Start: 2020-08-04 — End: 2020-08-09
  Administered 2020-08-04 – 2020-08-09 (×6): 81 mg via ORAL
  Filled 2020-08-03 (×6): qty 1

## 2020-08-03 MED ORDER — BISACODYL 10 MG RE SUPP
10.0000 mg | Freq: Every day | RECTAL | Status: DC | PRN
Start: 2020-08-03 — End: 2020-08-09
  Administered 2020-08-06: 10 mg via RECTAL
  Filled 2020-08-03 (×2): qty 1

## 2020-08-03 MED ORDER — PHENYLEPHRINE HCL (PRESSORS) 10 MG/ML IV SOLN
INTRAVENOUS | Status: AC
  Filled 2020-08-03: qty 1

## 2020-08-03 MED ORDER — MORPHINE SULFATE (PF) 2 MG/ML IV SOLN
0.5000 mg | INTRAVENOUS | Status: DC | PRN
Start: 2020-08-03 — End: 2020-08-09

## 2020-08-03 MED ORDER — MOMETASONE FURO-FORMOTEROL FUM 200-5 MCG/ACT IN AERO
2.0000 | INHALATION_SPRAY | Freq: Two times a day (BID) | RESPIRATORY_TRACT | Status: DC
  Administered 2020-08-04 – 2020-08-09 (×9): 2 via RESPIRATORY_TRACT
  Filled 2020-08-03: qty 8.8

## 2020-08-03 MED ORDER — IPRATROPIUM BROMIDE 0.02 % IN SOLN: 0.5000 mg | RESPIRATORY_TRACT | Status: DC | PRN

## 2020-08-03 MED ORDER — MAGNESIUM HYDROXIDE 400 MG/5ML PO SUSP
30.0000 mL | Freq: Every day | ORAL | Status: DC | PRN
  Administered 2020-08-05: 30 mL via ORAL
  Filled 2020-08-03: qty 30

## 2020-08-03 MED ORDER — ATORVASTATIN CALCIUM 10 MG PO TABS
10.0000 mg | ORAL_TABLET | Freq: Every day | ORAL | Status: DC
  Administered 2020-08-04 – 2020-08-09 (×6): 10 mg via ORAL
  Filled 2020-08-03 (×6): qty 1

## 2020-08-03 MED ORDER — NITROGLYCERIN 0.4 MG SL SUBL: 0.4000 mg | SUBLINGUAL_TABLET | SUBLINGUAL | Status: DC | PRN

## 2020-08-03 MED ORDER — KETOROLAC TROMETHAMINE 15 MG/ML IJ SOLN
7.5000 mg | Freq: Four times a day (QID) | INTRAMUSCULAR | Status: AC
  Administered 2020-08-03 – 2020-08-04 (×4): 7.5 mg via INTRAVENOUS
  Filled 2020-08-03 (×5): qty 1

## 2020-08-03 MED ORDER — EPHEDRINE SULFATE 50 MG/ML IJ SOLN
INTRAMUSCULAR | Status: DC | PRN
  Administered 2020-08-03: 10 mg via INTRAVENOUS
  Administered 2020-08-03 (×3): 5 mg via INTRAVENOUS
  Administered 2020-08-03: 15 mg via INTRAVENOUS

## 2020-08-03 MED ORDER — ACETAMINOPHEN 10 MG/ML IV SOLN
INTRAVENOUS | Status: AC
  Filled 2020-08-03: qty 100

## 2020-08-03 MED ORDER — ONDANSETRON HCL 4 MG/2ML IJ SOLN
4.0000 mg | Freq: Four times a day (QID) | INTRAMUSCULAR | Status: DC | PRN
  Administered 2020-08-04: 4 mg via INTRAVENOUS
  Filled 2020-08-03: qty 2

## 2020-08-03 MED ORDER — BUPIVACAINE LIPOSOME 1.3 % IJ SUSP
INTRAMUSCULAR | Status: AC
  Filled 2020-08-03: qty 20

## 2020-08-03 MED ORDER — PROPOFOL 10 MG/ML IV BOLUS
INTRAVENOUS | Status: DC | PRN
  Administered 2020-08-03 (×2): 20 mg via INTRAVENOUS

## 2020-08-03 MED ORDER — DIPHENHYDRAMINE HCL 25 MG PO CAPS
50.0000 mg | ORAL_CAPSULE | Freq: Every day | ORAL | Status: DC
  Administered 2020-08-03 – 2020-08-08 (×3): 50 mg via ORAL
  Filled 2020-08-03 (×3): qty 2

## 2020-08-03 MED ORDER — EPHEDRINE SULFATE 50 MG/ML IJ SOLN: 20.0000 mg | Freq: Once | INTRAMUSCULAR | Status: AC

## 2020-08-03 MED ORDER — FENTANYL CITRATE (PF) 100 MCG/2ML IJ SOLN
INTRAMUSCULAR | Status: AC
  Filled 2020-08-03: qty 2

## 2020-08-03 MED ORDER — BUDESON-GLYCOPYRROL-FORMOTEROL 160-9-4.8 MCG/ACT IN AERO: 2.0000 | INHALATION_SPRAY | Freq: Two times a day (BID) | RESPIRATORY_TRACT | Status: DC

## 2020-08-03 MED ORDER — SODIUM CHLORIDE FLUSH 0.9 % IV SOLN
INTRAVENOUS | Status: AC
  Filled 2020-08-03: qty 40

## 2020-08-03 MED ORDER — HYDROCODONE-ACETAMINOPHEN 5-325 MG PO TABS
1.0000 | ORAL_TABLET | ORAL | Status: DC | PRN
Start: 2020-08-03 — End: 2020-08-09
  Administered 2020-08-03 – 2020-08-04 (×2): 2 via ORAL
  Administered 2020-08-07 – 2020-08-08 (×3): 1 via ORAL
  Filled 2020-08-03: qty 1
  Filled 2020-08-03 (×2): qty 2
  Filled 2020-08-03 (×2): qty 1

## 2020-08-03 MED ORDER — LACTATED RINGERS IV BOLUS
500.0000 mL | Freq: Once | INTRAVENOUS | Status: AC
  Administered 2020-08-03: 500 mL via INTRAVENOUS

## 2020-08-03 MED ORDER — METOCLOPRAMIDE HCL 5 MG/ML IJ SOLN: 5.0000 mg | Freq: Three times a day (TID) | INTRAMUSCULAR | Status: DC | PRN

## 2020-08-03 MED ORDER — CEFAZOLIN SODIUM-DEXTROSE 2-4 GM/100ML-% IV SOLN: 2.0000 g | Freq: Four times a day (QID) | INTRAVENOUS | Status: DC

## 2020-08-03 MED ORDER — TRANEXAMIC ACID 1000 MG/10ML IV SOLN
INTRAVENOUS | Status: AC
  Filled 2020-08-03: qty 10

## 2020-08-03 MED ORDER — BUPIVACAINE-EPINEPHRINE (PF) 0.5% -1:200000 IJ SOLN
INTRAMUSCULAR | Status: AC
  Filled 2020-08-03: qty 30

## 2020-08-03 MED ORDER — VITAMIN B-12 1000 MCG PO TABS
1000.0000 ug | ORAL_TABLET | Freq: Every day | ORAL | Status: DC
  Administered 2020-08-04 – 2020-08-09 (×6): 1000 ug via ORAL
  Filled 2020-08-03 (×6): qty 1

## 2020-08-03 MED ORDER — ESCITALOPRAM OXALATE 10 MG PO TABS
10.0000 mg | ORAL_TABLET | Freq: Every day | ORAL | Status: DC
  Administered 2020-08-03 – 2020-08-09 (×7): 10 mg via ORAL
  Filled 2020-08-03 (×7): qty 1

## 2020-08-03 MED ORDER — METOCLOPRAMIDE HCL 10 MG PO TABS
5.0000 mg | ORAL_TABLET | Freq: Three times a day (TID) | ORAL | Status: DC | PRN
  Filled 2020-08-03: qty 1

## 2020-08-03 MED ORDER — CHLORHEXIDINE GLUCONATE 0.12 % MT SOLN
OROMUCOSAL | Status: AC
  Filled 2020-08-03: qty 15

## 2020-08-03 MED ORDER — PHENYLEPHRINE HCL (PRESSORS) 10 MG/ML IV SOLN
INTRAVENOUS | Status: DC | PRN
  Administered 2020-08-03 (×2): 100 ug via INTRAVENOUS
  Administered 2020-08-03 (×3): 200 ug via INTRAVENOUS
  Administered 2020-08-03: 300 ug via INTRAVENOUS
  Administered 2020-08-03: 100 ug via INTRAVENOUS

## 2020-08-03 MED ORDER — BUPIVACAINE HCL (PF) 0.5 % IJ SOLN
INTRAMUSCULAR | Status: DC | PRN
  Administered 2020-08-03: 2.5 mL

## 2020-08-03 MED ORDER — KETOROLAC TROMETHAMINE 15 MG/ML IJ SOLN: 15.0000 mg | Freq: Once | INTRAMUSCULAR | Status: AC

## 2020-08-03 MED ORDER — EPHEDRINE SULFATE 50 MG/ML IJ SOLN
INTRAMUSCULAR | Status: AC
  Administered 2020-08-03: 20 mg via INTRAVENOUS
  Filled 2020-08-03: qty 1

## 2020-08-03 MED ORDER — CLINDAMYCIN PHOSPHATE 900 MG/50ML IV SOLN
INTRAVENOUS | Status: AC
  Filled 2020-08-03: qty 50

## 2020-08-03 MED ORDER — CARVEDILOL 6.25 MG PO TABS
6.2500 mg | ORAL_TABLET | Freq: Two times a day (BID) | ORAL | Status: DC
  Administered 2020-08-04 – 2020-08-09 (×9): 6.25 mg via ORAL
  Filled 2020-08-03 (×10): qty 2

## 2020-08-03 MED ORDER — ALBUTEROL SULFATE HFA 108 (90 BASE) MCG/ACT IN AERS
1.0000 | INHALATION_SPRAY | Freq: Four times a day (QID) | RESPIRATORY_TRACT | Status: DC | PRN
  Filled 2020-08-03: qty 6.7

## 2020-08-03 SURGICAL SUPPLY — 62 items
APL PRP STRL LF DISP 70% ISPRP (MISCELLANEOUS) ×1
BLADE SAW SAG 25X90X1.19 (BLADE) ×2 IMPLANT
BLADE SURG SZ20 CARB STEEL (BLADE) ×2 IMPLANT
BNDG CMPR STD VLCR NS LF 5.8X6 (GAUZE/BANDAGES/DRESSINGS) ×1
BNDG ELASTIC 6X5.8 VLCR NS LF (GAUZE/BANDAGES/DRESSINGS) ×2 IMPLANT
BRNG TIB 67X12 ANT STAB MDLR (Insert) ×1 IMPLANT
CANISTER SUCT 1200ML W/VALVE (MISCELLANEOUS) ×2 IMPLANT
CANISTER SUCT 3000ML PPV (MISCELLANEOUS) ×2 IMPLANT
CEMENT BONE R 1X40 (Cement) ×4 IMPLANT
CEMENT VACUUM MIXING SYSTEM (MISCELLANEOUS) ×2 IMPLANT
CHLORAPREP W/TINT 26 (MISCELLANEOUS) ×2 IMPLANT
COMP FEMORAL CRUC LEFT 62.5MM (Joint) ×2 IMPLANT
COMP PAT 3 PEG SERIES A 31/6.2 (Miscellaneous) ×2 IMPLANT
COMPONENT FEMRL CRUC LT 62.5MM (Joint) ×1 IMPLANT
COMPONENT PAT3 PEG SERS 31/6.2 (Miscellaneous) ×1 IMPLANT
COOLER POLAR GLACIER W/PUMP (MISCELLANEOUS) ×2 IMPLANT
COVER MAYO STAND REUSABLE (DRAPES) ×2 IMPLANT
COVER WAND RF STERILE (DRAPES) ×2 IMPLANT
CUFF TOURN SGL QUICK 24 (TOURNIQUET CUFF)
CUFF TOURN SGL QUICK 30 (TOURNIQUET CUFF)
CUFF TRNQT CYL 24X4X16.5-23 (TOURNIQUET CUFF) IMPLANT
CUFF TRNQT CYL 30X4X21-28X (TOURNIQUET CUFF) IMPLANT
DRAPE 3/4 80X56 (DRAPES) ×2 IMPLANT
DRAPE IMP U-DRAPE 54X76 (DRAPES) ×2 IMPLANT
DRSG MEPILEX SACRM 8.7X9.8 (GAUZE/BANDAGES/DRESSINGS) IMPLANT
DRSG OPSITE POSTOP 4X10 (GAUZE/BANDAGES/DRESSINGS) ×2 IMPLANT
DRSG OPSITE POSTOP 4X8 (GAUZE/BANDAGES/DRESSINGS) ×2 IMPLANT
ELECT REM PT RETURN 9FT ADLT (ELECTROSURGICAL) ×2
ELECTRODE REM PT RTRN 9FT ADLT (ELECTROSURGICAL) ×1 IMPLANT
GAUZE XEROFORM 1X8 LF (GAUZE/BANDAGES/DRESSINGS) ×2 IMPLANT
GLOVE SRG 8 PF TXTR STRL LF DI (GLOVE) ×1 IMPLANT
GLOVE SURG ENC MOIS LTX SZ7.5 (GLOVE) ×8 IMPLANT
GLOVE SURG ENC MOIS LTX SZ8 (GLOVE) ×8 IMPLANT
GLOVE SURG UNDER LTX SZ8 (GLOVE) ×2 IMPLANT
GLOVE SURG UNDER POLY LF SZ8 (GLOVE) ×2
GOWN STRL REUS W/ TWL LRG LVL3 (GOWN DISPOSABLE) ×1 IMPLANT
GOWN STRL REUS W/ TWL XL LVL3 (GOWN DISPOSABLE) ×1 IMPLANT
GOWN STRL REUS W/TWL LRG LVL3 (GOWN DISPOSABLE) ×2
GOWN STRL REUS W/TWL XL LVL3 (GOWN DISPOSABLE) ×2
HOOD PEEL AWAY FLYTE STAYCOOL (MISCELLANEOUS) ×6 IMPLANT
INSERT TIB BEARING 67X12 (Insert) ×2 IMPLANT
IV NS IRRIG 3000ML ARTHROMATIC (IV SOLUTION) ×2 IMPLANT
KIT TURNOVER KIT A (KITS) ×2 IMPLANT
MANIFOLD NEPTUNE II (INSTRUMENTS) ×2 IMPLANT
NEEDLE SPNL 20GX3.5 QUINCKE YW (NEEDLE) ×2 IMPLANT
NS IRRIG 1000ML POUR BTL (IV SOLUTION) ×2 IMPLANT
PACK TOTAL KNEE (MISCELLANEOUS) ×2 IMPLANT
PAD WRAPON POLAR KNEE (MISCELLANEOUS) ×1 IMPLANT
PENCIL SMOKE EVACUATOR (MISCELLANEOUS) ×2 IMPLANT
PLATE INTERLOK 6700 (Plate) ×2 IMPLANT
PULSAVAC PLUS IRRIG FAN TIP (DISPOSABLE) ×2
STAPLER SKIN PROX 35W (STAPLE) ×2 IMPLANT
SUCTION FRAZIER HANDLE 10FR (MISCELLANEOUS) ×1
SUCTION TUBE FRAZIER 10FR DISP (MISCELLANEOUS) ×1 IMPLANT
SUT VIC AB 0 CT1 36 (SUTURE) ×6 IMPLANT
SUT VIC AB 2-0 CT1 27 (SUTURE) ×6
SUT VIC AB 2-0 CT1 TAPERPNT 27 (SUTURE) ×3 IMPLANT
SYR 10ML LL (SYRINGE) ×2 IMPLANT
SYR 20ML LL LF (SYRINGE) ×2 IMPLANT
SYR 30ML LL (SYRINGE) IMPLANT
TIP FAN IRRIG PULSAVAC PLUS (DISPOSABLE) ×1 IMPLANT
WRAPON POLAR PAD KNEE (MISCELLANEOUS) ×2

## 2020-08-03 NOTE — Op Note (Signed)
08/03/2020  1:22 PM  Patient:   Kimberly Woodward  Pre-Op Diagnosis:   Degenerative joint disease, left knee.  Post-Op Diagnosis:   Same  Procedure:   Left TKA using all-cemented [employer] Vanguard system with a 62.5 mm PCR femur, a 67 mm tibial tray with a 12 mm anterior stabilized E-poly insert, and a 31 x 6.2 mm all-poly 3-pegged domed patella.  Surgeon:   Pascal Lux, MD  Assistant:   Cameron Proud, PA-C   Anesthesia:   Spinal  Findings:   As above  Complications:   None  EBL:   25 cc  Fluids:   900 cc crystalloid  UOP:   None  TT:   90 minutes at 300 mmHg  Drains:   None  Closure:   Staples  Implants:   As above  Brief Clinical Note:   The patient is a 70 year old female with a long history of progressively worsening left knee pain. The patient's symptoms have progressed despite medications, activity modification, injections, etc. The patient's history and examination were consistent with progressive degenerative joint disease of the left knee confirmed by plain radiographs and an MRI scan. The patient presents at this time for a left total knee arthroplasty.  Procedure:   The patient was brought into the operating room. After adequate spinal anesthesia was obtained, the patient was lain in the supine position before the left lower extremity was prepped with ChloraPrep solution and draped sterilely. Preoperative antibiotics were administered. After verifying the proper laterality with a surgical timeout, the limb was exsanguinated with an Esmarch and the tourniquet inflated to 300 mmHg.   A standard anterior approach to the knee was made through an approximately 7 inch incision. The incision was carried down through the subcutaneous tissues to expose superficial retinaculum. This was split the length of the incision and the medial flap elevated sufficiently to expose the medial retinaculum. The medial retinaculum was incised, leaving a 3-4 mm cuff of tissue on the  patella. This was extended distally along the medial border of the patellar tendon and proximally through the medial third of the quadriceps tendon. A subtotal fat pad excision was performed before the soft tissues were elevated off the anteromedial and anterolateral aspects of the proximal tibia to the level of the collateral ligaments. The anterior portions of the medial and lateral menisci were removed, as was the anterior cruciate ligament. With the knee flexed to 90, the external tibial guide was positioned and the appropriate proximal tibial cut made. This piece was taken to the back table where it was measured and found to be optimally replicated by a 67 mm component.  Attention was directed to the distal femur. The intramedullary canal was accessed through a 3/8" drill hole. The intramedullary guide was inserted and positioned in order to obtain a neutral flexion gap. The intercondylar block was positioned with care taken to avoid notching the anterior cortex of the femur. The appropriate cut was made. Next, the distal cutting block was placed at 5 of valgus alignment. Using the 9 mm slot, the distal cut was made. The distal femur was measured and found to be optimally replicated by the 16.6 mm component. The 62.5 mm 4-in-1 cutting block was positioned and first the posterior, then the posterior chamfer, the anterior chamfer, and finally the anterior cuts were made. At this point, the posterior portions medial and lateral menisci were removed. A trial reduction was performed using the appropriate femoral and tibial components with first the 10  mm and then the 12 mm insert. The 12 mm insert demonstrated excellent stability to varus and valgus stressing both in flexion and extension while permitting full extension. Patella tracking was assessed and found to be excellent. Therefore, the tibial guide position was marked on the proximal tibia. The patella thickness was measured and found to be 20 mm.  Therefore, the appropriate cut was made. The patellar surface was measured and found to be optimally replicated by the 31 mm component. The three peg holes were drilled in place before the trial button was inserted. Patella tracking was assessed and found to be excellent, passing the "no thumb test". The lug holes were drilled into the distal femur before the trial component was removed, leaving only the tibial tray. The keel was then created using the appropriate tower, reamer, and punch.  The bony surfaces were prepared for cementing by irrigating them thoroughly with sterile saline solution via the jet lavage system. A bone plug was fashioned from some of the bone that had been removed previously and used to plug the distal femoral canal. In addition, 20 cc of Exparel diluted out to 60 cc with normal saline and 30 cc of 0.5% Sensorcaine were injected into the postero-medial and postero-lateral aspects of the knee, the medial and lateral gutter regions, and the peri-incisional tissues to help with postoperative analgesia. Meanwhile, the cement was being mixed on the back table. When it was ready, the tibial tray was cemented in first. The excess cement was removed using [employer] fast streamer. Next, the femoral component was impacted into place. Again, the excess cement was removed using [employer] fast streamer. The 12 mm trial insert was positioned and the knee brought into extension while the cement hardened. Finally, the patella was cemented into place and secured using the patellar clamp. Again, the excess cement was removed using [employer] fast streamer. Once the cement had hardened, the knee was placed through a range of motion with the findings as described above. Therefore, the trial insert was removed and, after verifying that no cement had been retained posteriorly, the permanent 12 mm anterior stabilized E-polyethylene insert was positioned and secured using the appropriate key locking mechanism. Again the knee was placed  through a range of motion with the findings as described above.  The wound was copiously irrigated with sterile saline solution using the jet lavage system before the quadriceps tendon and retinacular layer were reapproximated using #0 Vicryl interrupted sutures. The superficial retinacular layer also was closed using a running #0 Vicryl suture. A total of 10 cc of transexemic acid (TXA) was injected intra-articularly before the subcutaneous tissues were closed in several layers using 2-0 Vicryl interrupted sutures. The skin was closed using staples. A sterile honeycomb dressing was applied to the skin before the leg was wrapped with an Ace wrap to accommodate the Polar Care device. The patient was then awakened and returned to the recovery room in satisfactory condition after tolerating the procedure well.

## 2020-08-03 NOTE — Anesthesia Preprocedure Evaluation (Signed)
Anesthesia Evaluation  Patient identified by MRN, date of birth, ID band Patient awake    Reviewed: Allergy & Precautions, NPO status , Patient's Chart, lab work & pertinent test results  History of Anesthesia Complications Negative for: history of anesthetic complications  Airway Mallampati: II       Dental   Pulmonary COPD,  COPD inhaler, former smoker,    Pulmonary exam normal        Cardiovascular hypertension, Pt. on medications + CAD and + Peripheral Vascular Disease  (-) Past MI and (-) CHF Normal cardiovascular exam+ dysrhythmias (-) Valvular Problems/Murmurs     Neuro/Psych neg Seizures negative neurological ROS  negative psych ROS   GI/Hepatic Neg liver ROS, GERD  Poorly Controlled,  Endo/Other  diabetes (borderline)  Renal/GU Renal disease  negative genitourinary   Musculoskeletal   Abdominal Normal abdominal exam  (+)   Peds negative pediatric ROS (+)  Hematology negative hematology ROS (+)   Anesthesia Other Findings Past Medical History: No date: Colon polyps No date: COPD (chronic obstructive pulmonary disease) (HCC) No date: Coronary artery disease No date: GERD (gastroesophageal reflux disease) No date: History of kidney stones No date: Hyperlipidemia No date: Hypertension No date: LBBB (left bundle branch block) No date: Pre-diabetes No date: Subclavian arterial stenosis (HCC)     Comment:  s/p innominate artery PTA  Reproductive/Obstetrics                             Anesthesia Physical  Anesthesia Plan  ASA: III  Anesthesia Plan: Spinal   Post-op Pain Management:    Induction: Intravenous  PONV Risk Score and Plan: 3 and Ondansetron and Treatment may vary due to age or medical condition  Airway Management Planned: Nasal Cannula  Additional Equipment:   Intra-op Plan:   Post-operative Plan:   Informed Consent: I have reviewed the patients History  and Physical, chart, labs and discussed the procedure including the risks, benefits and alternatives for the proposed anesthesia with the patient or authorized representative who has indicated his/her understanding and acceptance.       Plan Discussed with: CRNA and Surgeon  Anesthesia Plan Comments:         Anesthesia Quick Evaluation

## 2020-08-03 NOTE — Plan of Care (Signed)
  Problem: Education: Goal: Knowledge of General Education information will improve Description: Including pain rating scale, medication(s)/side effects and non-pharmacologic comfort measures Outcome: Progressing   Problem: Health Behavior/Discharge Planning: Goal: Ability to manage health-related needs will improve Outcome: Progressing   Problem: Clinical Measurements: Goal: Ability to maintain clinical measurements within normal limits will improve Outcome: Progressing Goal: Will remain free from infection Outcome: Progressing Goal: Diagnostic test results will improve Outcome: Progressing Goal: Respiratory complications will improve Outcome: Progressing Goal: Cardiovascular complication will be avoided Outcome: Progressing   Problem: Activity: Goal: Risk for activity intolerance will decrease Outcome: Progressing   Problem: Nutrition: Goal: Adequate nutrition will be maintained Outcome: Progressing   Problem: Elimination: Goal: Will not experience complications related to bowel motility Outcome: Progressing Goal: Will not experience complications related to urinary retention Outcome: Progressing   Problem: Pain Managment: Goal: General experience of comfort will improve Outcome: Progressing   Problem: Safety: Goal: Ability to remain free from injury will improve Outcome: Progressing   Problem: Skin Integrity: Goal: Risk for impaired skin integrity will decrease Outcome: Progressing   Problem: Education: Goal: Knowledge of the prescribed therapeutic regimen will improve Outcome: Progressing Goal: Individualized Educational Video(s) Outcome: Progressing   Problem: Activity: Goal: Ability to avoid complications of mobility impairment will improve Outcome: Progressing Goal: Range of joint motion will improve Outcome: Progressing   Problem: Clinical Measurements: Goal: Postoperative complications will be avoided or minimized Outcome: Progressing   Problem:  Pain Management: Goal: Pain level will decrease with appropriate interventions Outcome: Progressing

## 2020-08-03 NOTE — Transfer of Care (Signed)
Immediate Anesthesia Transfer of Care Note  Patient: Kimberly Woodward  Procedure(s) Performed: TOTAL KNEE ARTHROPLASTY (Left Knee)  Patient Location: PACU  Anesthesia Type:Spinal  Level of Consciousness: awake and patient cooperative  Airway & Oxygen Therapy: Patient Spontanous Breathing and Patient connected to face mask oxygen  Post-op Assessment: Report given to RN and Post -op Vital signs reviewed and stable  Post vital signs: Reviewed and stable  Last Vitals:  Vitals Value Taken Time  BP 75/39 08/03/20 1332  Temp    Pulse 70 08/03/20 1332  Resp 17 08/03/20 1332  SpO2 95 % 08/03/20 1332    Last Pain:  Vitals:   08/03/20 0939  PainSc: 2          Complications: No complications documented.

## 2020-08-03 NOTE — Plan of Care (Addendum)
2020: Pt up to the side of the bed with WBAT on LLE. Pt used the BSC to void. Falls precautions remained in place, call bell within reach.  0430. CPM started per rder Problem: Pain Managment: Goal: General experience of comfort will improve Outcome: Progressing   Problem: Safety: Goal: Ability to remain free from injury will improve Outcome: Progressing   Problem: Skin Integrity: Goal: Risk for impaired skin integrity will decrease Outcome: Progressing   Problem: Activity: Goal: Ability to avoid complications of mobility impairment will improve Outcome: Progressing Goal: Range of joint motion will improve Outcome: Progressing

## 2020-08-03 NOTE — H&P (Signed)
History of Present Illness:  Kimberly Woodward is a 70 y.o. female who presents for follow-up now nearly 11 months status post a left knee arthroscopy with debridement, partial medial and lateral meniscectomies, excision of a suprapatella plica, and abrasion chondroplasty of grade 3 chondromalacial changes involving the medial femoral condyle. On today's visit, the patient describes a moderate "constant" pain in her knee which she rates at 6/10. She has been applying Voltaren gel to the knee several times daily with temporary partial relief of her symptoms. Otherwise, she is not taking any medications for discomfort at this time. She describes her pain as a "burning" pain across the anterior aspect of her knee. Her symptoms are aggravated by any prolonged standing or ambulation, as well as when she tries to get up after sitting for any length of time. She has difficulty reciprocating stairs, and often finds her self losing her balance and occasionally falling. She is becoming increasingly frustrated by her symptoms and functional limitations, and is ready to consider more aggressive treatment options.  Current Outpatient Medications: . albuterol (PROVENTIL) 2.5 mg /3 mL (0.083 %) nebulizer solution Take 3 mLs (2.5 mg total) by nebulization every 4 (four) hours as needed for Wheezing. 100 ampule 0  . albuterol 90 mcg/actuation inhaler Inhale 2 inhalations into the lungs every 6 (six) hours as needed for Wheezing 1 Inhaler 5  . amLODIPine (NORVASC) 10 MG tablet Take 0.5 tablets (5 mg total) by mouth once daily 45 tablet 1  . aspirin 81 MG EC tablet Take 81 mg by mouth.  Marland Kitchen atorvastatin (LIPITOR) 10 MG tablet Take 1 tablet (10 mg total) by mouth once daily 90 tablet 1  . carvediloL (COREG) 6.25 MG tablet Take 1 tablet (6.25 mg total) by mouth 2 (two) times daily with meals 180 tablet 1  . celecoxib (CELEBREX) 200 MG capsule Take 200 mg by mouth nightly  . cholecalciferol (VITAMIN D3) 1000 unit tablet Take by  mouth Take 1,000 Units by mouth daily.  . clopidogreL (PLAVIX) 75 mg tablet Take 1 tablet (75 mg total) by mouth once daily 90 tablet 3  . DULoxetine (CYMBALTA) 30 MG DR capsule Take 1 capsule (30 mg total) by mouth once daily 90 capsule 1  . esomeprazole (NEXIUM) 40 MG DR capsule Take 1 capsule (40 mg total) by mouth once daily 90 capsule 3  . FUROsemide (LASIX) 20 MG tablet Take 1 tablet (20 mg total) by mouth once daily as needed for Edema 90 tablet 3  . gabapentin (NEURONTIN) 300 MG capsule Take 1 capsule (300 mg total) by mouth nightly For neuropathy 90 capsule 3  . hydrocortisone (CORTEF) 10 MG tablet Take 1 (one) by mouth Daily in the am 90 tablet 0  . ipratropium-albuteroL (DUO-NEB) nebulizer solution USE 3 ML VIA NEBULIZER EVERY 6 HOURS AS NEEDED FOR WHEEZING OR SHORTNESS OF BREATH  . isosorbide mononitrate (IMDUR) 30 MG ER tablet Take 1 tablet (30 mg total) by mouth once daily In am for heart failure and circulation 90 tablet 3  . mirtazapine (REMERON) 30 MG tablet Take 1.5 tablets (45 mg total) by mouth nightly 1 pill daily at bedtime PO 135 tablet 3  . nitroGLYcerin (NITROSTAT) 0.4 MG SL tablet Place 1 tablet (0.4 mg total) under the tongue every 5 (five) minutes as needed for Chest pain May take up to 3 doses. 25 tablet 3  . potassium chloride (KLOR-CON) 10 MEQ ER tablet Take 2 tablets (20 mEq total) by mouth once daily Or as recommended  by physician 180 tablet 3  . prednisoLONE acetate (PRED FORTE) 1 % ophthalmic suspension  . tamsulosin (FLOMAX) 0.4 mg capsule Take 1 capsule (0.4 mg total) by mouth once daily as needed 90 capsule 3  . tiotropium-olodateroL (STIOLTO RESPIMAT) 2.5-2.5 mcg/actuation inhaler Inhale 2 inhalations into the lungs once daily 4 g 11  . triamterene-hydrochlorothiazide (MAXZIDE-25) 37.5-25 mg tablet Take 1 tablet by mouth once daily 90 tablet 1  . losartan (COZAAR) 25 MG tablet Take 1 tablet (25 mg total) by mouth once daily for 30 days 90 tablet 3   Allergies:   . Iodinated Contrast Media Hives  . Penicillins Hives  . Metrizamide Unknown   Past Medical History:  . Acute kidney failure (CMS-HCC) 08/02/2018  . Adrenal insufficiency (Addison's disease) (CMS-HCC) 08/08/2018  Diagnosis 07/2018 Admitted to [facility] with K over 6, Na 129 On hydrocortisone  . Chronic kidney disease Spouse   . Colon polyps  . COPD (chronic obstructive pulmonary disease) (CMS-HCC)  . Diabetes mellitus type 2, uncomplicated (CMS-HCC) Spouse   . Hyperlipidemia  . Hypertension  . Moderate episode of recurrent major depressive disorder (CMS-HCC) 04/28/2019  . Primary osteoarthritis of left knee 06/25/2019  . Stroke (CMS-HCC) Spouse    Past Surgical History:  . Arthroscopic debridement with partial medial and lateral meniscectomies, excision of symptomatic suprapatella plica, and abrasion chondroplasty of grade 3 chondromalacial changes of medial femoral condyle, left knee. Left 07/15/2019 (Dr. Roland Rack)  . CESAREAN SECTION  . CHOLECYSTECTOMY  . HERNIA REPAIR  . INCISIONAL BIOPSY BREAST   Family History:  . Diabetes type II Mother  . Hyperlipidemia (Elevated cholesterol) Mother  . High blood pressure (Hypertension) Mother  . Skin cancer Mother  . Stroke Mother  . Coronary Artery Disease (Blocked arteries around heart) Father  . Diabetes type II Father  . Hyperlipidemia (Elevated cholesterol) Father  . Skin cancer Father  . Stroke Father  . Colon cancer Brother  . Colon polyps Brother  . Coronary Artery Disease (Blocked arteries around heart) Brother  . Stroke Brother  . High blood pressure (Hypertension) Brother  . Colon polyps Daughter  . Stroke Daughter  . Thyroid disease Daughter  . High blood pressure (Hypertension) Brother  . Stroke Brother   Social History:   Socioeconomic History:  Marland Kitchen Marital status: Widowed  Spouse name: Not on file  . Number of children: Not on file  . Years of education: Not on file  . Highest education level: Not on file   Occupational History:  . Not on file  Tobacco Use:  . Smoking status: Former Smoker  Packs/day: 1.00  Years: 48.00  Pack years: 48.00  Types: Cigarettes  Quit date: 02/24/2015  Years since quitting: 5.2  . Smokeless tobacco: Never Used  Vaping Use  . Vaping Use: Never used  Substance and Sexual Activity  . Alcohol use: No  Alcohol/week: 0.0 standard drinks  . Drug use: No  . Sexual activity: Yes  Partners: Male  Birth control/protection: Surgical  Comment: 1979  Other Topics Concern  . Not on file  Social History Narrative  She is married to [employer].  They moved here from [place].  Wrecker service and repossessed cars.  Retired.   Social Determinants of Health:   Emergency planning/management officer Strain: Not on file  Food Insecurity: Food Insecurity Present  . Worried About Charity fundraiser in the Last Year: Sometimes true  . Ran Out of Food in the Last Year: Sometimes true  Transportation Needs: Unknown  .  Lack of Transportation (Medical): No  . Lack of Transportation (Non-Medical): Not on file   Review of Systems:  A comprehensive 14 point ROS was performed, reviewed, and the pertinent orthopaedic findings are documented in the HPI.  Physical Exam: Vitals:  05/17/20 1424  BP: 112/64  Weight: 87.8 kg (193 lb 9.6 oz)  Height: 160 cm (5\' 3" )  PainSc: 6  PainLoc: Knee   General/Constitutional: The patient appears to be well-nourished, well-developed, and in no acute distress. Neuro/Psych: Normal mood and affect, oriented to person, place and time.  Eyes: Non-icteric. Pupils are equal, round, and reactive to light, and exhibit synchronous movement. ENT: Unremarkable. Lymphatic: No palpable adenopathy. Respiratory: Lungs clear to auscultation, Normal chest excursion, No wheezes and Non-labored breathing Cardiovascular: Regular rate and rhythm. No murmurs. and No edema, swelling or tenderness, except as noted in detailed exam. Integumentary: No impressive skin  lesions present, except as noted in detailed exam. Musculoskeletal: Unremarkable, except as noted in detailed exam.  Left knee exam: The patient demonstrates a mild limp, but is not using any assistive devices. On inspection, her arthroscopic portal sites again are well-healedandwithout evidence for infection. No swelling, erythema, ecchymosis, abrasions are noted around the left knee, nor is there any effusion. She notes moderate tenderness to palpation over the anterolateralmore so than the anteromedial aspectsof the knee, but there is no tenderness palpation along left or posterior portions of the medial or lateral joint lines, nor around the patella. She is able to extend her knee to 0 degrees and flex beyond115 degrees with mild soreness anteriorly. The knee is stable to varusandvalgus stressing. Her patella tracks well and is without crepitance. She isneurovascularly intact to the left lower extremity and foot.  Assessment: 1. Primary osteoarthritis of left knee.   Plan: The treatment options were discussed with the patient. In addition, patient educational materials were provided regarding the diagnosis and treatment options. The patient is quite frustrated by her persistent symptoms and function limitations, and is ready to consider more aggressive treatment options. Therefore, I have recommended a surgical procedure, specifically a left total knee arthroplasty. The procedure was discussed with the patient, as were the potential risks (including bleeding, infection, nerve and/or blood vessel injury, persistent or recurrent pain, loosening and/or failure of the components, dislocation, need for further surgery, blood clots, strokes, heart attacks and/or arhythmias, pneumonia, etc.) and benefits. The patient states his/her understanding and wishes to proceed. All of the patient's questions and concerns were answered. She can call any time with further concerns. She will follow up  post-surgery, routine.   H&P reviewed and patient re-examined. No changes.

## 2020-08-03 NOTE — Anesthesia Procedure Notes (Signed)
Spinal  Patient location during procedure: OR Start time: 08/03/2020 11:00 AM End time: 08/03/2020 11:05 AM Reason for block: surgical anesthesia Staffing Anesthesiologist: Alvin Critchley, MD Resident/CRNA: Debe Coder, CRNA Preanesthetic Checklist Completed: patient identified, IV checked, site marked, risks and benefits discussed, surgical consent, monitors and equipment checked, pre-op evaluation and timeout performed Spinal Block Patient position: sitting Prep: DuraPrep Patient monitoring: heart rate, cardiac monitor, continuous pulse ox and blood pressure Approach: midline Location: L3-4 Injection technique: single-shot Needle Needle type: Sprotte  Needle gauge: 24 G Needle length: 9 cm Assessment Sensory level: T4 Events: CSF return

## 2020-08-04 ENCOUNTER — Encounter: Payer: Self-pay | Admitting: Surgery

## 2020-08-04 LAB — CBC
HCT: 30.6 % — ABNORMAL LOW (ref 36.0–46.0)
Hemoglobin: 9.7 g/dL — ABNORMAL LOW (ref 12.0–15.0)
MCH: 27.6 pg (ref 26.0–34.0)
MCHC: 31.7 g/dL (ref 30.0–36.0)
MCV: 87.2 fL (ref 80.0–100.0)
Platelets: 226 10*3/uL (ref 150–400)
RBC: 3.51 MIL/uL — ABNORMAL LOW (ref 3.87–5.11)
RDW: 13.9 % (ref 11.5–15.5)
WBC: 9 10*3/uL (ref 4.0–10.5)
nRBC: 0 % (ref 0.0–0.2)

## 2020-08-04 LAB — BASIC METABOLIC PANEL
Anion gap: 7 (ref 5–15)
BUN: 28 mg/dL — ABNORMAL HIGH (ref 8–23)
CO2: 26 mmol/L (ref 22–32)
Calcium: 8.6 mg/dL — ABNORMAL LOW (ref 8.9–10.3)
Chloride: 95 mmol/L — ABNORMAL LOW (ref 98–111)
Creatinine, Ser: 1.46 mg/dL — ABNORMAL HIGH (ref 0.44–1.00)
GFR, Estimated: 39 mL/min — ABNORMAL LOW (ref >60)
Glucose, Bld: 121 mg/dL — ABNORMAL HIGH (ref 70–99)
Potassium: 4.5 mmol/L (ref 3.5–5.1)
Sodium: 128 mmol/L — ABNORMAL LOW (ref 135–145)

## 2020-08-04 MED ORDER — SODIUM CHLORIDE 1 G PO TABS
1.0000 g | ORAL_TABLET | Freq: Two times a day (BID) | ORAL | Status: DC
  Administered 2020-08-04 – 2020-08-09 (×10): 1 g via ORAL
  Filled 2020-08-04 (×10): qty 1

## 2020-08-04 MED ORDER — SODIUM CHLORIDE 0.9 % IV BOLUS
500.0000 mL | Freq: Once | INTRAVENOUS | Status: AC
  Administered 2020-08-04: 500 mL via INTRAVENOUS

## 2020-08-04 MED ORDER — ENOXAPARIN SODIUM 40 MG/0.4ML ~~LOC~~ SOLN: 40.0000 mg | SUBCUTANEOUS | 0 refills | Status: DC

## 2020-08-04 MED ORDER — HYDROCODONE-ACETAMINOPHEN 5-325 MG PO TABS: 1.0000 | ORAL_TABLET | ORAL | 0 refills | Status: DC | PRN

## 2020-08-04 MED ORDER — TRAMADOL HCL 50 MG PO TABS: 50.0000 mg | ORAL_TABLET | Freq: Four times a day (QID) | ORAL | 0 refills | Status: DC | PRN

## 2020-08-04 NOTE — Progress Notes (Signed)
  Concerned with low BP: 88/54.  Obtained first Manual BP: 88/50. Second Manual BP:90/58, HR:64 continuous IVF: 75 mL/Hr. Pt experienced lightheadedness & Nauseous. Notified PA. Per PA, hold off narcotics. Per Pt's history with chronic hyponatremia and sodium 128, no need to increase maintenance fluid at this time.  Per PA, continue to monitor and re-check BP within an hour.

## 2020-08-04 NOTE — Anesthesia Postprocedure Evaluation (Signed)
Anesthesia Post Note  Patient: Kimberly Woodward  Procedure(s) Performed: TOTAL KNEE ARTHROPLASTY (Left Knee)  Patient location during evaluation: Nursing Unit Anesthesia Type: Spinal Level of consciousness: oriented and awake and alert Pain management: pain level controlled Vital Signs Assessment: post-procedure vital signs reviewed and stable Respiratory status: spontaneous breathing and respiratory function stable Cardiovascular status: blood pressure returned to baseline and stable Postop Assessment: no headache, no backache, no apparent nausea or vomiting and patient able to bend at knees Anesthetic complications: no   No complications documented.   Last Vitals:  Vitals:   08/04/20 0402 08/04/20 0740  BP: (!) 102/47 (!) 103/45  Pulse: 68 69  Resp: 16 16  Temp: 36.4 C 36.7 C  SpO2: 93% 100%    Last Pain:  Vitals:   08/04/20 0402  TempSrc: Oral  PainSc:                  Alison Stalling

## 2020-08-04 NOTE — Care Management CC44 (Signed)
Condition Code 44 Documentation Completed  Patient Details  Name: Kimberly Woodward MRN: [id number] Date of Birth: [birth year]   Condition Code 44 given:   Yes Patient signature on Condition Code 44 notice:   Yes Documentation of 2 MD's agreement: Yes   Code 44 added to claim:   Yes    Pete Pelt, RN 08/04/2020, 8:59 AM

## 2020-08-04 NOTE — Discharge Instructions (Signed)
Diet: As you were doing prior to hospitalization   Shower:  May shower but keep the wounds dry, use an occlusive plastic wrap, NO SOAKING IN TUB.  If the bandage gets wet, change with a clean dry gauze.  Dressing:  You may change your dressing as needed. Change the dressing with sterile gauze dressing.    Activity:  Increase activity slowly as tolerated, but follow the weight bearing instructions below.  No lifting or driving for 6 weeks.  Weight Bearing:   Weight bearing as tolerated to left lower extremity  To prevent constipation: you may use a stool softener such as -  Colace (over the counter) 100 mg by mouth twice a day  Drink plenty of fluids (prune juice may be helpful) and high fiber foods Miralax (over the counter) for constipation as needed.    Itching:  If you experience itching with your medications, try taking only a single pain pill, or even half a pain pill at a time.  You may take up to 10 pain pills per day, and you can also use benadryl over the counter for itching or also to help with sleep.   Precautions:  If you experience chest pain or shortness of breath - call 911 immediately for transfer to the hospital emergency department!!  If you develop a fever greater that 101 F, purulent drainage from wound, increased redness or drainage from wound, or calf pain-Call [place]                                              Follow- Up Appointment:  Please call for an appointment to be seen in 2 weeks at [facility]  Take Lovenox daily for the next two weeks and continue with aspirin.  Do not take the Plavix at this time.  We will re-start the Plavix at the first post-op visit.

## 2020-08-04 NOTE — Progress Notes (Signed)
PT Cancellation Note  Patient Details Name: Kimberly Woodward MRN: [id number] DOB: 18-Apr-1951   Cancelled Treatment:    Reason Eval/Treat Not Completed: Medical issues which prohibited therapy (Per chart review and discussion with primary RN, patient remains generally hypotensive; recommends hold on PM session for this date.  Will continue efforts in AM as medically appropriate.)   Christie Viscomi H. Owens Shark, PT, DPT, NCS 08/04/20, 3:04 PM [phone]

## 2020-08-04 NOTE — Progress Notes (Signed)
  Subjective: 1 Day Post-Op Procedure(s) (LRB): TOTAL KNEE ARTHROPLASTY (Left) Patient reports pain as moderate.   Patient is well, and has had no acute complaints or problems PT and care management to assist with discharge planning, current plan is for discharge home with HHPT. Negative for chest pain and shortness of breath Fever: no Gastrointestinal:Negative for nausea and vomiting  Objective: Vital signs in last 24 hours: Temp:  [97.4 F (36.3 C)-98 F (36.7 C)] 98 F (36.7 C) (03/30 0740) Pulse Rate:  [58-70] 69 (03/30 0740) Resp:  [11-23] 16 (03/30 0740) BP: (72-113)/(38-52) 103/45 (03/30 0740) SpO2:  [89 %-100 %] 100 % (03/30 0740) Weight:  [85.3 kg-86.2 kg] 85.3 kg (03/29 1600)  Intake/Output from previous day:  Intake/Output Summary (Last 24 hours) at 08/04/2020 0741 Last data filed at 08/04/2020 0420 Gross per 24 hour  Intake 2031.41 ml  Output 225 ml  Net 1806.41 ml    Intake/Output this shift: No intake/output data recorded.  Labs: Recent Labs    08/03/20 0946 08/04/20 0511  HGB 12.9 9.7*   Recent Labs    08/03/20 0946 08/04/20 0511  WBC  --  9.0  RBC  --  3.51*  HCT 38.0 30.6*  PLT  --  226   Recent Labs    08/03/20 0946 08/04/20 0511  NA 134* 128*  K 4.3 4.5  CL 93* 95*  CO2  --  26  BUN 21 28*  CREATININE 1.00 1.46*  GLUCOSE 97 121*  CALCIUM  --  8.6*   No results for input(s): LABPT, INR in the last 72 hours.   EXAM General - Patient is Alert, Appropriate and Oriented Extremity - ABD soft Sensation intact distally Dorsiflexion/Plantar flexion intact Incision: dressing C/D/I No cellulitis present Dressing/Incision - clean, dry, no drainage to the left knee honeycomb dressing. Motor Function - intact, moving foot and toes well on exam.  Abdomen soft with normal bowel sounds.  Past Medical History:  Diagnosis Date  . Colon polyps   . COPD (chronic obstructive pulmonary disease) ([place])   . Coronary artery disease   . GERD  (gastroesophageal reflux disease)   . History of kidney stones   . Hyperlipidemia   . Hypertension   . LBBB (left bundle branch block)   . Pre-diabetes   . Subclavian arterial stenosis ([facility])    s/p innominate artery PTA    Assessment/Plan: 1 Day Post-Op Procedure(s) (LRB): TOTAL KNEE ARTHROPLASTY (Left) Active Problems:   Status post total knee replacement using cement, left  Estimated body mass index is 33.3 kg/m as calculated from the following:   Height as of this encounter: 5\' 3"  (1.6 m).   Weight as of this encounter: 85.3 kg. Advance diet Up with therapy D/C IV fluids when tolerating po intake.  Labs reviewed this AM, Hg 9.7 this morning. Na 128, chronic for the patient. Up with therapy today, plan for possible d/c home today pending progress. Work on Bm at this time.  DVT Prophylaxis - Lovenox, Foot Pumps and TED hose Weight-Bearing as tolerated to left leg  J. Cameron Proud, PA-C [facility] 08/04/2020, 7:41 AM

## 2020-08-04 NOTE — Progress Notes (Signed)
  Current manual BP:92/58, HR: 65 Notified PA. Per PA. administer bolus 500 mL NaCl once.  Will continue to monitor.

## 2020-08-04 NOTE — Discharge Summary (Addendum)
Physician Discharge Summary  Patient ID: Kimberly Woodward MRN: [id number] DOB/AGE: 12-Jun-1950 70 y.o.  Admit date: 08/03/2020 Discharge date: August 10, 2020  Admission Diagnoses:  Status-post left total knee arthroplasty, with cement.  Discharge Diagnoses: Patient Active Problem List   Diagnosis Date Noted  . Status post total knee replacement using cement, right 08/05/2020  . Status post total knee replacement using cement, left 08/03/2020  . Adrenal insufficiency (Addison's disease) ([place]) 08/08/2018  . Hyponatremia 08/02/2018  . Hyperkalemia 08/02/2018  . Acute kidney failure ([place]) 08/02/2018  . Obesity (BMI 35.0-39.9 without comorbidity) 01/15/2018  . Diabetes mellitus type 2, uncomplicated ([place]) 78/29/5621  . Bilateral carotid bruits 08/23/2017  . 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 Nausea & vomiting 08/17/2017  . CAD (coronary artery disease) 06/13/2016  . Essential hypertension 10/15/2013  . COPD (chronic obstructive pulmonary disease) ([place]) 10/15/2013  . Chest pain, unspecified 10/15/2013  . Family history of malignant neoplasm of gastrointestinal tract 07/23/2013  . Diarrhea 07/23/2013  . Benign neoplasm of colon 07/23/2013    Past Medical History:  Diagnosis Date  . Colon polyps   . COPD (chronic obstructive pulmonary disease) ([place])   . Coronary artery disease   . GERD (gastroesophageal reflux disease)   . History of kidney stones   . Hyperlipidemia   . Hypertension   . LBBB (left bundle branch block)   . Pre-diabetes   . Subclavian arterial stenosis ([facility])    s/p innominate artery PTA     Transfusion: None.   Consultants (if any):   Discharged Condition: Improved  Hospital Course: Kimberly Woodward is an 70 y.o. female who was admitted 08/03/2020 with a diagnosis of degenerative joint disease of the left knee and went to the operating room on 08/03/2020 and underwent the above named procedures.    Surgeries: Procedure(s): TOTAL  KNEE ARTHROPLASTY on 08/03/2020 Patient tolerated the surgery well. Taken to PACU where she was stabilized and then transferred to the orthopedic floor.  Started on Lovenox 40mg  q 24 hrs. Foot pumps applied bilaterally at 80 mm. Heels elevated on bed with rolled towels. No evidence of DVT. Negative [place]. Physical therapy started on day #1 for gait training and transfer. OT started day #1 for ADL and assisted devices.  Patient's IV was removed on August 06, 2020  Patient with hypotension on POD1, received IV fluid bolus and BP medications were held at this time.  BP improved over the course of her hospital stay.  K+ on August 06, 2020 was 5.3, oral Klor-con was discontinued.  Patient was having hypotension, so CT angiogram was ordered to rule out pulmonary embolus.  The result was negative.    The patient did physical therapy on postop day 4.  She was working on a bowel movement for postop day 4. Patient stable for discharge but awaiting placement to SNF  On post op day 6 patient was stable and ready for discharge to SNF  Implants: Left TKA using all-cemented [employer] Vanguard system with a 62.5 mm PCR femur, a 67 mm tibial tray with a 12 mm anterior stabilized E-poly insert, and a 31 x 6.2 mm all-poly 3-pegged domed patella.  She was given perioperative antibiotics:  Anti-infectives (From admission, onward)   Start     Dose/Rate Route Frequency Ordered Stop   08/03/20 1800  clindamycin (CLEOCIN) IVPB 600 mg        600 mg 100 mL/hr over 30 Minutes Intravenous Every 6 hours 08/03/20 1641 08/04/20 3086  08/03/20 1645  ceFAZolin (ANCEF) IVPB 2g/100 mL premix  Status:  Discontinued        2 g 200 mL/hr over 30 Minutes Intravenous Every 6 hours 08/03/20 1555 08/03/20 1641   08/03/20 0955  clindamycin (CLEOCIN) 900 MG/50ML IVPB       Note to Pharmacy: [place] [place]   : cabinet override      08/03/20 0955 08/03/20 1111   08/03/20 0600  clindamycin (CLEOCIN) IVPB 900 mg        900 mg 100 mL/hr  over 30 Minutes Intravenous On call to O.R. 08/02/20 2303 08/03/20 1110    .  She was given sequential compression devices, early ambulation, and Lovenox for DVT prophylaxis.  She benefited maximally from the hospital stay and there were no complications.    Recent vital signs:  Vitals:   08/09/20 0316 08/09/20 0822  BP: (!) 126/49 (!) 125/53  Pulse: 83 85  Resp: 18 18  Temp: 99 F (37.2 C) 98.1 F (36.7 C)  SpO2: 92% 100%    Recent laboratory studies:  Lab Results  Component Value Date   HGB 8.7 (L) 08/06/2020   HGB 9.7 (L) 08/05/2020   HGB 9.7 (L) 08/04/2020   Lab Results  Component Value Date   WBC 9.5 08/06/2020   PLT 204 08/06/2020   Lab Results  Component Value Date   INR 0.93 05/22/2018   Lab Results  Component Value Date   NA 132 (L) 08/09/2020   K 3.8 08/09/2020   CL 93 (L) 08/09/2020   CO2 31 08/09/2020   BUN 10 08/09/2020   CREATININE 0.63 08/09/2020   GLUCOSE 105 (H) 08/09/2020    Discharge Medications:   Allergies as of 08/09/2020      Reactions   Ivp Dye [iodinated Diagnostic Agents] Hives   Penicillins Hives   Did it involve swelling of the face/tongue/throat, SOB, or low BP? Unknown Did it involve sudden or severe rash/hives, skin peeling, or any reaction on the inside of your mouth or nose? Yes Did you need to seek medical attention at a hospital or doctor's office? Patient was inpatient when incident occurred When did it last happen?More than 15 years ago If all above answers are "NO", may proceed with cephalosporin use.      Medication List    TAKE these medications   albuterol 108 (90 Base) MCG/ACT inhaler Commonly known as: VENTOLIN HFA Inhale 1-2 puffs into the lungs every 6 (six) hours as needed (wheezing/shortness of breath).   albuterol (2.5 MG/3ML) 0.083% nebulizer solution Commonly known as: PROVENTIL Take 2.5 mg by nebulization every 6 (six) hours as needed for wheezing or shortness of breath.   ALPRAZolam 0.25 MG  tablet Commonly known as: XANAX Take 0.25 mg by mouth at bedtime.   amLODipine 10 MG tablet Commonly known as: NORVASC Take 10 mg by mouth daily in the afternoon.   Anoro Ellipta 62.5-25 MCG/INH Aepb Generic drug: umeclidinium-vilanterol Inhale 1 puff into the lungs in the morning, at noon, and at bedtime.   aspirin 81 MG chewable tablet Chew 81 mg by mouth daily.   atorvastatin 10 MG tablet Commonly known as: LIPITOR Take 10 mg by mouth daily.   Breztri Aerosphere 160-9-4.8 MCG/ACT Aero Generic drug: Budeson-Glycopyrrol-Formoterol Inhale into the lungs.   carvedilol 6.25 MG tablet Commonly known as: COREG Take 6.25 mg by mouth in the morning and at bedtime.   cholecalciferol 25 MCG (1000 UNIT) tablet Commonly known as: VITAMIN D Take 1,000 Units by  mouth daily.   clopidogrel 75 MG tablet Commonly known as: PLAVIX Take 75 mg by mouth daily.   diphenhydrAMINE 50 MG tablet Commonly known as: BENADRYL Take 50 mg by mouth at bedtime.   DULoxetine 30 MG capsule Commonly known as: CYMBALTA Take 30 mg by mouth daily.   enoxaparin 40 MG/0.4ML injection Commonly known as: LOVENOX Inject 0.4 mLs (40 mg total) into the skin daily.   escitalopram 10 MG tablet Commonly known as: LEXAPRO Take 10 mg by mouth daily.   esomeprazole 40 MG capsule Commonly known as: NEXIUM Take 40 mg by mouth daily.   furosemide 20 MG tablet Commonly known as: LASIX Take 20 mg by mouth every evening.   gabapentin 300 MG capsule Commonly known as: NEURONTIN Take 300 mg by mouth at bedtime.   HYDROcodone-acetaminophen 5-325 MG tablet Commonly known as: NORCO/VICODIN Take 1-2 tablets by mouth every 4 (four) hours as needed for moderate pain. What changed:   when to take this  reasons to take this  additional instructions   hydrocortisone 10 MG tablet Commonly known as: CORTEF Take 10 mg by mouth in the morning and at bedtime.   ipratropium 0.02 % nebulizer solution Commonly  known as: ATROVENT Take 0.5 mg by nebulization every 4 (four) hours as needed for wheezing or shortness of breath.   isosorbide mononitrate 30 MG 24 hr tablet Commonly known as: IMDUR Take 30 mg by mouth every morning.   multivitamin with minerals Tabs tablet Take 1 tablet by mouth daily.   nitroGLYCERIN 0.4 MG SL tablet Commonly known as: NITROSTAT Place 0.4 mg under the tongue every 5 (five) minutes x 3 doses as needed for chest pain.   potassium chloride 10 MEQ tablet Commonly known as: KLOR-CON Take 20 mEq by mouth daily.   Stiolto Respimat 2.5-2.5 MCG/ACT Aers Generic drug: Tiotropium Bromide-Olodaterol   tamsulosin 0.4 MG Caps capsule Commonly known as: FLOMAX Take 1 capsule (0.4 mg total) by mouth daily.   traMADol 50 MG tablet Commonly known as: ULTRAM Take 1-2 tablets (50-100 mg total) by mouth every 6 (six) hours as needed for moderate pain.   triamterene-hydrochlorothiazide 37.5-25 MG tablet Commonly known as: MAXZIDE-25 Take 1 tablet by mouth daily.   vitamin B-12 1000 MCG tablet Commonly known as: CYANOCOBALAMIN Take 1,000 mcg by mouth daily.            Durable Medical Equipment  (From admission, onward)         Start     Ordered   08/03/20 1555  DME Bedside commode  Once       Question:  Patient needs a bedside commode to treat with the following condition  Answer:  Status post total knee replacement using cement, left   08/03/20 1555   08/03/20 1555  DME 3 n 1  Once        08/03/20 1555   08/03/20 1555  DME Walker rolling  Once       Question Answer Comment  Walker: With 5 Inch Wheels   Patient needs a walker to treat with the following condition Status post total knee replacement using cement, left      08/03/20 1555          Diagnostic Studies: CT ANGIO CHEST PE W OR WO CONTRAST  Result Date: 08/06/2020 CLINICAL DATA:  High probability for pulmonary embolism. Shortness of breath and chest pain. Recent knee replacement EXAM: CT  ANGIOGRAPHY CHEST WITH CONTRAST TECHNIQUE: Multidetector CT imaging of the chest was  performed using the standard protocol during bolus administration of intravenous contrast. Multiplanar CT image reconstructions and MIPs were obtained to evaluate the vascular anatomy. CONTRAST:  63mL OMNIPAQUE IOHEXOL 350 MG/ML SOLN COMPARISON:  None. FINDINGS: Cardiovascular: Normal heart size. No pericardial effusion. Multifocal aortic and coronary atherosclerosis. Heavily calcified great vessel ostia status post right proximal subclavian stenting. There is likely high-grade narrowing at the left subclavian origin and left common carotid origin. Flow reducing stenosis may be present at the proximal subclavian on the right. For indication is limited by contrast timing and streak artifact. Mediastinum/Nodes: Negative for adenopathy or mass. Lungs/Pleura: Emphysematous changes and diffuse airway thickening. There is no edema, consolidation, effusion, or pneumothorax. Subpleural lobulated nodule in the right upper lobe on 6:27 measuring 6 mm average diameter. Upper Abdomen: Extensive atheromatous plaque.  Cholecystectomy. Musculoskeletal: No acute finding Review of the MIP images confirms the above findings. IMPRESSION: 1. Negative for pulmonary embolism or other acute finding. 2. 6 mm pulmonary nodule in the right upper lobe. There is a background of COPD. Non-contrast chest CT at 6-12 months is recommended. If the nodule is stable at time of repeat CT, then future CT at 18-24 months is recommended for high-risk patients. This recommendation follows the consensus statement: Guidelines for Management of Incidental Pulmonary Nodules Detected on CT Images: From the Fleischner Society 2017; Radiology 2017; 284:228-243. 3. Marked atherosclerosis with multifocal high-grade narrowing of the great vessels. Electronically Signed   By: Monte Fantasia M.D.   On: 08/06/2020 11:23   DG Knee Left Port  Result Date: 08/03/2020 CLINICAL DATA:   Status post total knee replacement using cement. EXAM: PORTABLE LEFT KNEE - 1-2 VIEW COMPARISON:  None. FINDINGS: Left knee arthroplasty in expected alignment. Slight cortical irregularity of the anterior distal femur just above the femoral component, postsurgical. There has been patellar resurfacing. Recent postsurgical change includes air and edema in the soft tissues and joint space. Anterior skin staples. IMPRESSION: Status post left knee arthroplasty without immediate postoperative complication. Electronically Signed   By: Keith Rake M.D.   On: 08/03/2020 15:22   Disposition: Discharge disposition: 03-Skilled [place] for discharge possibly to SNF on 08/06/20 pending progess with PT.  Patient will continue Lovenox for two weeks prior to returning to Plavix.   Contact information for follow-up providers    Lattie Corns, PA-C On 08/18/2020.   Specialty: Physician Assistant Why: 9:45 am. Lajean Saver Contact information: [place] [phone]            Contact information for after-discharge care    Destination    [facility] Preferred SNF .   Service: Skilled Nursing Contact information: [address] [place] [phone]                 Signed: Feliberto Gottron PA-C 08/09/2020, 9:03 AM

## 2020-08-04 NOTE — Evaluation (Signed)
Physical Therapy Evaluation Patient Details Name: Kimberly Woodward MRN: [id number] DOB: 01-14-1951 Today's Date: 08/04/2020   History of Present Illness  admitted for acute hospitalization s/p L TKR (08/03/20), WBAT  Clinical Impression  Patient resting in bed, semi-supine upon arrival to room.  Alert and oriented, follows simple commands; agreeable to participation with session, but does endorse significant pain to L LE (FACES 8/10) despite recent pain medication administration.  L LE with significant strength (grossly 2-/5) and ROM limitations (all joints) due to pain.  After several reps of supine LE therex to assess tolerance to mobility (with HOB elevated approximately 45 degrees), patient reporting general sense of dizziness/lightheadedness.  BP measured and noted at 88/40 L UE, 81/36 R UE.  Patient returned to supine and transitioned to trendelenburg; RN notified of vitals and response to activity, to bedside for assessment. Additional OOB/mobility attempts deferred at this time.  Will continue efforts next session/date as medically appropriate. Would benefit from skilled PT to address above deficits and promote optimal return to PLOF.; recommend transition to STR upon discharge from acute hospitalization.     Follow Up Recommendations SNF    Equipment Recommendations       Recommendations for Other Services       Precautions / Restrictions Precautions Precautions: Fall Restrictions Weight Bearing Restrictions: Yes LLE Weight Bearing: Weight bearing as tolerated      Mobility  Bed Mobility               General bed mobility comments: unsafe/unable to tolerate due to symptomatic hypotension    Transfers                 General transfer comment: unsafe/unable to tolerate due to symptomatic hypotension  Ambulation/Gait             General Gait Details: unsafe/unable to tolerate due to symptomatic hypotension  Stairs            Wheelchair  Mobility    Modified Rankin (Stroke Patients Only)       Balance       Sitting balance - Comments: unsafe/unable to tolerate due to symptomatic hypotension                                     Pertinent Vitals/Pain Pain Assessment: Faces Faces Pain Scale: Hurts even more Pain Location: L knee Pain Descriptors / Indicators: Aching;Grimacing;Guarding Pain Intervention(s): Limited activity within patient's tolerance;Monitored during session;Premedicated before session;Repositioned    Home Living Family/patient expects to be discharged to:: Private residence Living Arrangements: Alone     Home Access: Stairs to enter Entrance Stairs-Rails: Left Entrance Stairs-Number of Steps: 3 steps -> platform -> chair lift Home Layout: One level Home Equipment: Cane - single point      Prior Function Level of Independence: Independent         Comments: Mod indep for ADLs, household and limited community mobilization; intermittent use of SPC "on bad days" or when outside of home.  Does endorse multiple fall history.  No home O2.     Hand Dominance        Extremity/Trunk Assessment   Upper Extremity Assessment Upper Extremity Assessment: Overall WFL for tasks assessed    Lower Extremity Assessment Lower Extremity Assessment: Generalized weakness (L LE grossly 2-/5, significantly limited by pain; endorses some persistent paresthesia L foot)       Communication   Communication: No  difficulties  Cognition Arousal/Alertness: Lethargic Behavior During Therapy: Flat affect Overall Cognitive Status: Within Functional Limits for tasks assessed                                        General Comments      Exercises Other Exercises Other Exercises: Supine LE therex, 1x10: ankle pumps, quad sets.  After several reps with HOB elevated approximately 45 degrees, patient reporting general sense of dizziness/lightheadedness.  BP measured and noted at  88/40 L UE, 81/36 R UE.  Patient returned to supine and transitioned to trendelenburg; RN notified of vitals and response to activity, to bedside for assessment.   Assessment/Plan    PT Assessment Patient needs continued PT services  PT Problem List Decreased strength;Decreased range of motion;Decreased activity tolerance;Decreased mobility;Decreased coordination;Decreased cognition;Decreased knowledge of use of DME;Decreased balance;Decreased safety awareness;Decreased knowledge of precautions;Decreased skin integrity;Pain       PT Treatment Interventions DME instruction;Gait training;Stair training;Functional mobility training;Therapeutic activities;Therapeutic exercise;Balance training;Patient/family education    PT Goals (Current goals can be found in the Care Plan section)  Acute Rehab PT Goals Patient Stated Goal: "to get better so i can go home and see my family" PT Goal Formulation: With patient Time For Goal Achievement: 08/18/20 Potential to Achieve Goals: Good    Frequency BID   Barriers to discharge        Co-evaluation               AM-PAC PT "6 Clicks" Mobility  Outcome Measure Help needed turning from your back to your side while in a flat bed without using bedrails?: Total Help needed moving from lying on your back to sitting on the side of a flat bed without using bedrails?: Total Help needed moving to and from a bed to a chair (including a wheelchair)?: Total Help needed standing up from a chair using your arms (e.g., wheelchair or bedside chair)?: Total Help needed to walk in hospital room?: Total Help needed climbing 3-5 steps with a railing? : Total 6 Click Score: 6    End of Session Equipment Utilized During Treatment: Gait belt Activity Tolerance: Treatment limited secondary to medical complications (Comment) (symptomatic orthostasis) Patient left: in bed;with call bell/phone within reach;with bed alarm set Nurse Communication: Mobility status PT  Visit Diagnosis: Muscle weakness (generalized) (M62.81);Difficulty in walking, not elsewhere classified (R26.2);Pain Pain - Right/Left: Left Pain - part of body: Knee    Time: 6644-0347 PT Time Calculation (min) (ACUTE ONLY): 26 min   Charges:   PT Evaluation $PT Eval Moderate Complexity: 1 Mod         Brynnly Bonet H. Owens Shark, PT, DPT, NCS 08/04/20, 10:55 AM [phone]

## 2020-08-05 DIAGNOSIS — I2510 Atherosclerotic heart disease of native coronary artery without angina pectoris: Secondary | ICD-10-CM | POA: Diagnosis present

## 2020-08-05 DIAGNOSIS — J449 Chronic obstructive pulmonary disease, unspecified: Secondary | ICD-10-CM | POA: Diagnosis present

## 2020-08-05 DIAGNOSIS — E876 Hypokalemia: Secondary | ICD-10-CM | POA: Diagnosis present

## 2020-08-05 DIAGNOSIS — Z79899 Other long term (current) drug therapy: Secondary | ICD-10-CM | POA: Diagnosis not present

## 2020-08-05 DIAGNOSIS — Z803 Family history of malignant neoplasm of breast: Secondary | ICD-10-CM | POA: Diagnosis not present

## 2020-08-05 DIAGNOSIS — Z7902 Long term (current) use of antithrombotics/antiplatelets: Secondary | ICD-10-CM | POA: Diagnosis not present

## 2020-08-05 DIAGNOSIS — E119 Type 2 diabetes mellitus without complications: Secondary | ICD-10-CM | POA: Diagnosis present

## 2020-08-05 DIAGNOSIS — K219 Gastro-esophageal reflux disease without esophagitis: Secondary | ICD-10-CM | POA: Diagnosis present

## 2020-08-05 DIAGNOSIS — M1712 Unilateral primary osteoarthritis, left knee: Secondary | ICD-10-CM | POA: Diagnosis present

## 2020-08-05 DIAGNOSIS — I447 Left bundle-branch block, unspecified: Secondary | ICD-10-CM | POA: Diagnosis present

## 2020-08-05 DIAGNOSIS — Z808 Family history of malignant neoplasm of other organs or systems: Secondary | ICD-10-CM | POA: Diagnosis not present

## 2020-08-05 DIAGNOSIS — Z888 Allergy status to other drugs, medicaments and biological substances status: Secondary | ICD-10-CM | POA: Diagnosis not present

## 2020-08-05 DIAGNOSIS — Z823 Family history of stroke: Secondary | ICD-10-CM | POA: Diagnosis not present

## 2020-08-05 DIAGNOSIS — Z96651 Presence of right artificial knee joint: Secondary | ICD-10-CM

## 2020-08-05 DIAGNOSIS — E785 Hyperlipidemia, unspecified: Secondary | ICD-10-CM | POA: Diagnosis present

## 2020-08-05 DIAGNOSIS — Z833 Family history of diabetes mellitus: Secondary | ICD-10-CM | POA: Diagnosis not present

## 2020-08-05 DIAGNOSIS — Z87891 Personal history of nicotine dependence: Secondary | ICD-10-CM | POA: Diagnosis not present

## 2020-08-05 DIAGNOSIS — Z880 Allergy status to penicillin: Secondary | ICD-10-CM | POA: Diagnosis not present

## 2020-08-05 DIAGNOSIS — Z800 Family history of malignant neoplasm of digestive organs: Secondary | ICD-10-CM | POA: Diagnosis not present

## 2020-08-05 DIAGNOSIS — Z7982 Long term (current) use of aspirin: Secondary | ICD-10-CM | POA: Diagnosis not present

## 2020-08-05 DIAGNOSIS — E871 Hypo-osmolality and hyponatremia: Secondary | ICD-10-CM | POA: Diagnosis present

## 2020-08-05 DIAGNOSIS — I959 Hypotension, unspecified: Secondary | ICD-10-CM | POA: Diagnosis not present

## 2020-08-05 DIAGNOSIS — Z20822 Contact with and (suspected) exposure to covid-19: Secondary | ICD-10-CM | POA: Diagnosis present

## 2020-08-05 DIAGNOSIS — Z8249 Family history of ischemic heart disease and other diseases of the circulatory system: Secondary | ICD-10-CM | POA: Diagnosis not present

## 2020-08-05 DIAGNOSIS — E271 Primary adrenocortical insufficiency: Secondary | ICD-10-CM | POA: Diagnosis present

## 2020-08-05 LAB — CBC
HCT: 31.4 % — ABNORMAL LOW (ref 36.0–46.0)
Hemoglobin: 9.7 g/dL — ABNORMAL LOW (ref 12.0–15.0)
MCH: 27.6 pg (ref 26.0–34.0)
MCHC: 30.9 g/dL (ref 30.0–36.0)
MCV: 89.5 fL (ref 80.0–100.0)
Platelets: 206 10*3/uL (ref 150–400)
RBC: 3.51 MIL/uL — ABNORMAL LOW (ref 3.87–5.11)
RDW: 13.8 % (ref 11.5–15.5)
WBC: 10.4 10*3/uL (ref 4.0–10.5)
nRBC: 0 % (ref 0.0–0.2)

## 2020-08-05 LAB — BASIC METABOLIC PANEL
Anion gap: 6 (ref 5–15)
BUN: 26 mg/dL — ABNORMAL HIGH (ref 8–23)
CO2: 26 mmol/L (ref 22–32)
Calcium: 9 mg/dL (ref 8.9–10.3)
Chloride: 98 mmol/L (ref 98–111)
Creatinine, Ser: 1.07 mg/dL — ABNORMAL HIGH (ref 0.44–1.00)
GFR, Estimated: 56 mL/min — ABNORMAL LOW (ref >60)
Glucose, Bld: 107 mg/dL — ABNORMAL HIGH (ref 70–99)
Potassium: 5.2 mmol/L — ABNORMAL HIGH (ref 3.5–5.1)
Sodium: 130 mmol/L — ABNORMAL LOW (ref 135–145)

## 2020-08-05 MED ORDER — DIPHENHYDRAMINE HCL 25 MG PO CAPS
50.0000 mg | ORAL_CAPSULE | Freq: Once | ORAL | Status: DC
Start: 2020-08-06 — End: 2020-08-05

## 2020-08-05 MED ORDER — DIPHENHYDRAMINE HCL 50 MG/ML IJ SOLN: 50.0000 mg | Freq: Once | INTRAMUSCULAR | Status: DC

## 2020-08-05 MED ORDER — DIPHENHYDRAMINE HCL 50 MG/ML IJ SOLN: 50.0000 mg | Freq: Once | INTRAMUSCULAR | Status: AC

## 2020-08-05 MED ORDER — PREDNISONE 50 MG PO TABS
50.0000 mg | ORAL_TABLET | Freq: Four times a day (QID) | ORAL | Status: AC
  Administered 2020-08-05 – 2020-08-06 (×3): 50 mg via ORAL
  Filled 2020-08-05 (×3): qty 1

## 2020-08-05 MED ORDER — DIPHENHYDRAMINE HCL 25 MG PO CAPS
50.0000 mg | ORAL_CAPSULE | Freq: Once | ORAL | Status: AC
  Administered 2020-08-06: 50 mg via ORAL
  Filled 2020-08-05: qty 2

## 2020-08-05 NOTE — Progress Notes (Signed)
  Subjective: 2 Days Post-Op Procedure(s) (LRB): TOTAL KNEE ARTHROPLASTY (Left) Patient reports pain as moderate.   Patient is well but is having issues with hypotension. PT and care management to assist with discharge planning, current plan is for SNF at this time. Negative for chest pain and shortness of breath Fever: no Gastrointestinal:Negative for nausea and vomiting Reports headache but feels that it is improving.  Objective: Vital signs in last 24 hours: Temp:  [97.6 F (36.4 C)-98.8 F (37.1 C)] 98.8 F (37.1 C) (03/31 0836) Pulse Rate:  [61-100] 100 (03/31 0836) Resp:  [16-20] 20 (03/31 0836) BP: (94-112)/(40-61) 106/61 (03/31 0836) SpO2:  [90 %-97 %] 90 % (03/31 1057)  Intake/Output from previous day:  Intake/Output Summary (Last 24 hours) at 08/05/2020 1431 Last data filed at 08/05/2020 1141 Gross per 24 hour  Intake 988.47 ml  Output --  Net 988.47 ml    Intake/Output this shift: Total I/O In: 200 [P.O.:200] Out: -   Labs: Recent Labs    08/03/20 0946 08/04/20 0511 08/05/20 0549  HGB 12.9 9.7* 9.7*   Recent Labs    08/04/20 0511 08/05/20 0549  WBC 9.0 10.4  RBC 3.51* 3.51*  HCT 30.6* 31.4*  PLT 226 206   Recent Labs    08/04/20 0511 08/05/20 0549  NA 128* 130*  K 4.5 5.2*  CL 95* 98  CO2 26 26  BUN 28* 26*  CREATININE 1.46* 1.07*  GLUCOSE 121* 107*  CALCIUM 8.6* 9.0   No results for input(s): LABPT, INR in the last 72 hours.   EXAM General - Patient is Alert, Appropriate and Oriented Extremity - ABD soft Sensation intact distally Dorsiflexion/Plantar flexion intact Incision: dressing C/D/I No cellulitis present Dressing/Incision - clean, dry, no drainage to the left knee honeycomb dressing. Motor Function - intact, moving foot and toes well on exam.  Abdomen soft with normal bowel sounds.  Past Medical History:  Diagnosis Date  . Colon polyps   . COPD (chronic obstructive pulmonary disease) ([place])   . Coronary artery disease    . GERD (gastroesophageal reflux disease)   . History of kidney stones   . Hyperlipidemia   . Hypertension   . LBBB (left bundle branch block)   . Pre-diabetes   . 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   Weight as of this encounter: 85.3 kg. Advance diet Up with therapy  Labs reviewed this AM. K+ 5.2, discontinued Klor con tablet, continue to monitor. Patient with hypotension yesterday, fluid bolus given, BP meds stopped.  Recent BP 106/61. History of hyponatremia, sodium tablets ordered. Hg 9.7 this AM. Continue with PT, patient is progressing slowly.  Current plan is for d/c to SNF. Work on [employer] at this time.  DVT Prophylaxis - Lovenox, Foot Pumps and TED hose Weight-Bearing as tolerated to left leg  J. Cameron Proud, PA-C [facility] Orthopaedic Surgery 08/05/2020, 2:31 PM

## 2020-08-05 NOTE — Progress Notes (Signed)
Physical Therapy Treatment Patient Details Name: Kimberly Woodward MRN: [id number] DOB: Mar 29, 1951 Today's Date: 08/05/2020    History of Present Illness admitted for acute hospitalization s/p L TKR (08/03/20), WBAT    PT Comments    Patient generally lethargic; difficulty maintaining alertness and overall attention/concentration to task at hand throughout session.  Limited/no carry-over of cuing and education from previous session.  Generally unsteady and unsafe with all functional activities. Does maintain BP in 120s/50-60s throughout session; however, continues to desat 84-88% on 5L with minimal exertion.  RN informed/aware.  Of note, patient noted with vaping device in hand upon arrival to session.  Patient initially denying device, but ultimately provided to unit director (2 devices) to be securely stored during remaining stay.     Follow Up Recommendations  SNF     Equipment Recommendations       Recommendations for Other Services       Precautions / Restrictions Precautions Precautions: Fall Restrictions Weight Bearing Restrictions: Yes LLE Weight Bearing: Weight bearing as tolerated    Mobility  Bed Mobility Overal bed mobility: Needs Assistance Bed Mobility: Supine to Sit;Sit to Supine     Supine to sit: Min assist;Mod assist Sit to supine: Mod assist;Max assist   General bed mobility comments: assist for LE management, movement sequencing    Transfers Overall transfer level: Needs assistance Equipment used: Rolling walker (2 wheeled) Transfers: Sit to/from Stand Sit to Stand: Mod assist;Min assist;+2 physical assistance         General transfer comment: cuing for hand placement; limited/no carry-over of cuing and education from AM session  Ambulation/Gait Ambulation/Gait assistance: Min assist;Mod assist;+2 safety/equipment Gait Distance (Feet):  (5'x2) Assistive device: Rolling walker (2 wheeled)       General Gait Details: 3-point, step to  gait pattern; poor loading L LE, but stabilizes fairly well without buckling. Heavy WBing on RW.  Assist for walker management and positioning this date due to lethargy, difficulty focusing/concentrating on task   Stairs             Wheelchair Mobility    Modified Rankin (Stroke Patients Only)       Balance Overall balance assessment: Needs assistance Sitting-balance support: No upper extremity supported;Feet supported Sitting balance-Leahy Scale: Fair     Standing balance support: Bilateral upper extremity supported Standing balance-Leahy Scale: Poor                              Cognition Arousal/Alertness: Lethargic Behavior During Therapy: Restless                                   General Comments: limited insight into medical situation, functional needs and overall safety concerns      Exercises Other Exercises Other Exercises: Toilet transfer, SPT with RW, min/mod assist +2 for safety.  Sit/stand from [facility] with RW, min/mod assist +2    General Comments        Pertinent Vitals/Pain Pain Assessment: Faces Faces Pain Scale: Hurts whole lot Pain Location: L knee Pain Descriptors / Indicators: Aching;Grimacing;Guarding Pain Intervention(s): Limited activity within patient's tolerance;Monitored during session;Repositioned    Home Living                      Prior Function            PT Goals (current goals can now  be found in the care plan section) Acute Rehab PT Goals Patient Stated Goal: "to get better so i can go home and see my family" PT Goal Formulation: With patient Time For Goal Achievement: 08/18/20 Potential to Achieve Goals: Fair Progress towards PT goals: Not progressing toward goals - comment    Frequency    BID      PT Plan Current plan remains appropriate    Co-evaluation              AM-PAC PT "6 Clicks" Mobility   Outcome Measure  Help needed turning from your back to your side  while in a flat bed without using bedrails?: A Lot Help needed moving from lying on your back to sitting on the side of a flat bed without using bedrails?: A Lot Help needed moving to and from a bed to a chair (including a wheelchair)?: A Lot Help needed standing up from a chair using your arms (e.g., wheelchair or bedside chair)?: A Lot Help needed to walk in hospital room?: A Lot Help needed climbing 3-5 steps with a railing? : Total 6 Click Score: 11    End of Session Equipment Utilized During Treatment: Gait belt Activity Tolerance: Patient limited by pain;Patient limited by lethargy Patient left: in bed;with call bell/phone within reach;with bed alarm set Nurse Communication: Mobility status PT Visit Diagnosis: Muscle weakness (generalized) (M62.81);Difficulty in walking, not elsewhere classified (R26.2);Pain Pain - Right/Left: Left Pain - part of body: Knee     Time: 8889-1694 PT Time Calculation (min) (ACUTE ONLY): 28 min  Charges:  $Therapeutic Activity: 23-37 mins                    [place] Frieden H. Owens Shark, PT, DPT, NCS 08/05/20, 8:42 PM [phone]

## 2020-08-05 NOTE — Progress Notes (Signed)
Patient has orders for stat CT angio. This nurse calls CT dept to check status and was notified that patient has an allergy noted and needs premedicated prior to scheduled time to be scanned at 0800 in the morning.

## 2020-08-05 NOTE — TOC Progression Note (Signed)
Transition of Care [facility]) - Progression Note    Patient Details  Name: Kimberly Woodward MRN: [id number] Date of Birth: 01-24-1951  Transition of Care [facility]) CM/SW [place], RN Phone Number: 08/05/2020, 4:38 PM  Clinical Narrative:   TOC in to see patient at bedside re: discharge planning.  Current recommendation is for SNF, patient is declining SNF at this time, states to talk to her daughter.  TOC attempted to contact daughter via phone, and left message.  Recommendation remains SNF, will try to contact daughter in AM to discuss further.  TOC will follow through discharge.         Expected Discharge Plan and Services                                                 Social Determinants of Health (SDOH) Interventions    Readmission Risk Interventions No flowsheet data found.

## 2020-08-05 NOTE — Progress Notes (Signed)
Physical Therapy Treatment Patient Details Name: Kimberly Woodward MRN: [id number] DOB: Oct 16, 1950 Today's Date: 08/05/2020    History of Present Illness admitted for acute hospitalization s/p L TKR (08/03/20), WBAT    PT Comments    Patient awake and alert upon arrival to room; generally restless and highly distracted by external environment.  Currently requiring physical assist of +2 to complete all mobility tasks (for safety).  Very limited ability to actively initiate L quad with isolated therex; does demonstrate fair activation in closed-chain, WBing (stabilizes knee without buckling).  Very guarded and quick to abort all tasks due to pain. Was able to complete OOB to chair and very short-distance gait (5'x2) with RW; constant monitoring of vitals and overall level of alertness with all movement efforts. Significant orthostasis with transition to upright (72/46 at 2 min standing; see vitals flowsheet for full assessment), requiring 5-6L supplemental O2 to maintain sats >90% throughout session.  Continue to recommend STR upon discharge; unable to safely and effectively mobilize household distances or negotiate steps required to enter/exit home upon discharge.     Follow Up Recommendations  SNF     Equipment Recommendations       Recommendations for Other Services       Precautions / Restrictions Precautions Precautions: Fall Restrictions Weight Bearing Restrictions: Yes LLE Weight Bearing: Weight bearing as tolerated    Mobility  Bed Mobility Overal bed mobility: Needs Assistance Bed Mobility: Supine to Sit     Supine to sit: Max assist;+2 for physical assistance     General bed mobility comments: max assist for LE management, truncal elevation    Transfers Overall transfer level: Needs assistance Equipment used: Rolling walker (2 wheeled) Transfers: Sit to/from Stand Sit to Stand: Min assist;Min guard         General transfer comment: cuing for hand  placement  Ambulation/Gait Ambulation/Gait assistance: Min guard;Min assist Gait Distance (Feet):  (5' x2) Assistive device: Rolling walker (2 wheeled)       General Gait Details: 3-point, step to gait pattern; poor loading L LE, but stabilizes fairly well without buckling. Heavy WBing on RW.  Very limited by pain and orthostatic response to upright positioning   Stairs             Wheelchair Mobility    Modified Rankin (Stroke Patients Only)       Balance Overall balance assessment: Needs assistance Sitting-balance support: No upper extremity supported;Feet supported Sitting balance-Leahy Scale: Fair     Standing balance support: Bilateral upper extremity supported Standing balance-Leahy Scale: Poor                              Cognition   Behavior During Therapy: WFL for tasks assessed/performed Overall Cognitive Status: Within Functional Limits for tasks assessed                                 General Comments: limited insight into medical situation, functional needs and overall safety concerns      Exercises Total Joint Exercises Goniometric ROM: L knee: 8-65 degrees, limited by pain Other Exercises Other Exercises: Supine L LE therex, 1x10, act assist ROM: ankle pumps, SAQs, heel slides, hip abduct/adduct.  Limited ability to actively initaite L quad activation; very guarded due to pain. Other Exercises: Toilet transfer, SPT with RW, cga/min assist +2 for safety.  Sit/stand from [facility] with RW,  cga/min assist +2    General Comments        Pertinent Vitals/Pain Pain Assessment: Faces Faces Pain Scale: Hurts whole lot Pain Location: L knee Pain Descriptors / Indicators: Aching;Grimacing;Guarding Pain Intervention(s): Limited activity within patient's tolerance;Monitored during session;Premedicated before session;Repositioned    Home Living                      Prior Function            PT Goals (current goals  can now be found in the care plan section) Acute Rehab PT Goals Patient Stated Goal: "to get better so i can go home and see my family" PT Goal Formulation: With patient Time For Goal Achievement: 08/18/20 Potential to Achieve Goals: Good Progress towards PT goals: Progressing toward goals    Frequency    BID      PT Plan Current plan remains appropriate    Co-evaluation              AM-PAC PT "6 Clicks" Mobility   Outcome Measure  Help needed turning from your back to your side while in a flat bed without using bedrails?: Total Help needed moving from lying on your back to sitting on the side of a flat bed without using bedrails?: Total Help needed moving to and from a bed to a chair (including a wheelchair)?: A Lot Help needed standing up from a chair using your arms (e.g., wheelchair or bedside chair)?: A Lot Help needed to walk in hospital room?: A Lot Help needed climbing 3-5 steps with a railing? : Total 6 Click Score: 9    End of Session Equipment Utilized During Treatment: Gait belt Activity Tolerance: Treatment limited secondary to medical complications (Comment) (symptomatic orthostasis) Patient left: with call bell/phone within reach;in chair;with chair alarm set Nurse Communication: Mobility status PT Visit Diagnosis: Muscle weakness (generalized) (M62.81);Difficulty in walking, not elsewhere classified (R26.2);Pain Pain - Right/Left: Left Pain - part of body: Knee     Time: 0174-9449 PT Time Calculation (min) (ACUTE ONLY): 57 min  Charges:  $Gait Training: 8-22 mins $Therapeutic Exercise: 8-22 mins $Therapeutic Activity: 38-52 mins                     Rickie Gutierres H. Owens Shark, PT, DPT, NCS 08/05/20, 11:10 AM [phone]

## 2020-08-06 ENCOUNTER — Encounter: Payer: Self-pay | Admitting: Surgery

## 2020-08-06 ENCOUNTER — Inpatient Hospital Stay: Payer: Medicare HMO

## 2020-08-06 LAB — CBC
HCT: 27 % — ABNORMAL LOW (ref 36.0–46.0)
Hemoglobin: 8.7 g/dL — ABNORMAL LOW (ref 12.0–15.0)
MCH: 27.8 pg (ref 26.0–34.0)
MCHC: 32.2 g/dL (ref 30.0–36.0)
MCV: 86.3 fL (ref 80.0–100.0)
Platelets: 204 10*3/uL (ref 150–400)
RBC: 3.13 MIL/uL — ABNORMAL LOW (ref 3.87–5.11)
RDW: 13.5 % (ref 11.5–15.5)
WBC: 9.5 10*3/uL (ref 4.0–10.5)
nRBC: 0 % (ref 0.0–0.2)

## 2020-08-06 LAB — BASIC METABOLIC PANEL
Anion gap: 5 (ref 5–15)
BUN: 15 mg/dL (ref 8–23)
CO2: 27 mmol/L (ref 22–32)
Calcium: 8.8 mg/dL — ABNORMAL LOW (ref 8.9–10.3)
Chloride: 94 mmol/L — ABNORMAL LOW (ref 98–111)
Creatinine, Ser: 0.74 mg/dL (ref 0.44–1.00)
GFR, Estimated: >60 mL/min (ref >60)
Glucose, Bld: 159 mg/dL — ABNORMAL HIGH (ref 70–99)
Potassium: 5.3 mmol/L — ABNORMAL HIGH (ref 3.5–5.1)
Sodium: 126 mmol/L — ABNORMAL LOW (ref 135–145)

## 2020-08-06 MED ORDER — IOHEXOL 350 MG/ML SOLN
75.0000 mL | Freq: Once | INTRAVENOUS | Status: AC | PRN
  Administered 2020-08-06: 75 mL via INTRAVENOUS

## 2020-08-06 NOTE — Care Management Important Message (Signed)
Important Message  Patient Details  Name: Kimberly Woodward MRN: [id number] Date of Birth: May 13, 1950   Medicare Important Message Given:  N/A - LOS <3 / Initial given by admissions     Juliann Pulse A Dahlila Pfahler 08/06/2020, 3:29 PM

## 2020-08-06 NOTE — Progress Notes (Signed)
Physical Therapy Treatment Patient Details Name: GLADYES KUDO MRN: [id number] DOB: 1950-06-21 Today's Date: 08/06/2020    History of Present Illness admitted for acute hospitalization s/p L TKR (08/03/20), WBAT    PT Comments    Patient with noted improvement in level of alertness, awareness and overall tolerance for session this date.  Able to initiate gait training with RW, 50' x1, min assist +2 for safety.   Demonstrates 3-point, step to gait pattern; heavy WBing bilat UEs.  Forward flexed posture, decreased loading/stance time L LE.  Very slow and deliberate; multiple standing rest breaks required to complete distance. Not adequate for household distances home alone.  BP appears more stable and WFL with position changes; continues to require 3L supplemental O2 to maintain sats >90% at rest and with exertion.  Anticipate need for O2 at discharge.  Extended discussion with patient regarding functional abilities, level of assist required, benefit of continued rehab services (at max frequency) and safety concerns with discharge home alone.  After indepth review, patient agreeable to consider transition to rehab, with goal of optimizing safety/indep prior to discharge home alone.  TOC informed/aware and to follow up with bed search.  SaO2 on room air at rest = 84% SaO2 on room on 2L O2 at rest = 93% SaO2 on 2L O2 with exertion/gait = 91%      Follow Up Recommendations  SNF     Equipment Recommendations       Recommendations for Other Services       Precautions / Restrictions Precautions Precautions: Fall Restrictions Weight Bearing Restrictions: Yes LLE Weight Bearing: Weight bearing as tolerated    Mobility  Bed Mobility Overal bed mobility: Needs Assistance Bed Mobility: Supine to Sit     Supine to sit: Supervision     General bed mobility comments: increased time/effort; requiring elevated HOB and significant use of bedrails to complete.  Prefers transition to  long sitting, then moving LEs towards edge of bed    Transfers Overall transfer level: Needs assistance Equipment used: Rolling walker (2 wheeled) Transfers: Sit to/from Stand Sit to Stand: Min assist         General transfer comment: cuing for hand placement; maintains L LE anterior to BOS, limited/no active use with movement transitions  Ambulation/Gait Ambulation/Gait assistance: Min assist;+2 safety/equipment Gait Distance (Feet): 50 Feet Assistive device: Rolling walker (2 wheeled)       General Gait Details: 3-point, step to gait pattern; heavy WBing bilat UEs.  Forward flexed posture, decreased loading/stance time L LE.  Very slow and deliberate; multiple standing rest breaks required to complete distance.   Stairs             Wheelchair Mobility    Modified Rankin (Stroke Patients Only)       Balance Overall balance assessment: Needs assistance Sitting-balance support: No upper extremity supported;Feet supported Sitting balance-Leahy Scale: Good     Standing balance support: Bilateral upper extremity supported Standing balance-Leahy Scale: Fair                              Cognition Arousal/Alertness: Awake/alert Behavior During Therapy: WFL for tasks assessed/performed Overall Cognitive Status: Within Functional Limits for tasks assessed                                        Exercises Total Joint  Exercises Goniometric ROM: L knee: 8-65 degrees, limited by pain    General Comments        Pertinent Vitals/Pain Pain Assessment: Faces Faces Pain Scale: Hurts little more Pain Location: L knee Pain Descriptors / Indicators: Aching;Grimacing;Guarding Pain Intervention(s): Limited activity within patient's tolerance;Monitored during session;Repositioned    Home Living                      Prior Function            PT Goals (current goals can now be found in the care plan section) Acute Rehab PT  Goals Patient Stated Goal: "to get better so i can go home and see my family" PT Goal Formulation: With patient Time For Goal Achievement: 08/18/20 Potential to Achieve Goals: Fair Progress towards PT goals: Progressing toward goals    Frequency    BID      PT Plan Current plan remains appropriate    Co-evaluation              AM-PAC PT "6 Clicks" Mobility   Outcome Measure  Help needed turning from your back to your side while in a flat bed without using bedrails?: None Help needed moving from lying on your back to sitting on the side of a flat bed without using bedrails?: None Help needed moving to and from a bed to a chair (including a wheelchair)?: A Little Help needed standing up from a chair using your arms (e.g., wheelchair or bedside chair)?: A Little Help needed to walk in hospital room?: A Little Help needed climbing 3-5 steps with a railing? : A Lot 6 Click Score: 19    End of Session Equipment Utilized During Treatment: Gait belt Activity Tolerance: Patient limited by pain;Patient tolerated treatment well Patient left: with call bell/phone within reach;in chair;with chair alarm set Nurse Communication: Mobility status PT Visit Diagnosis: Muscle weakness (generalized) (M62.81);Difficulty in walking, not elsewhere classified (R26.2);Pain Pain - Right/Left: Left     Time: 8850-2774 PT Time Calculation (min) (ACUTE ONLY): 34 min  Charges:  $Gait Training: 8-22 mins $Therapeutic Activity: 8-22 mins                     Prinston Kynard H. Owens Shark, PT, DPT, NCS 08/06/20, 3:26 PM [phone]

## 2020-08-06 NOTE — TOC Progression Note (Signed)
Transition of Care [facility]) - Progression Note    Patient Details  Name: Kimberly Woodward MRN: [id number] Date of Birth: Jan 27, 1951  Transition of Care [facility]) CM/SW [place], RN Phone Number: 08/06/2020, 3:15 PM  Clinical Narrative:   Spoke with the patient at the bedside, she is agreeable to go to SNF , PASSR and bedsearch started         Expected Discharge Plan and Services           Expected Discharge Date: 08/06/20                                     Social Determinants of Health (SDOH) Interventions    Readmission Risk Interventions No flowsheet data found.

## 2020-08-06 NOTE — Evaluation (Signed)
Occupational Therapy Evaluation Patient Details Name: Kimberly Woodward MRN: [id number] DOB: [birth year] Today's Date: 08/06/2020    History of Present Illness admitted for acute hospitalization s/p L TKR (08/03/20), WBAT   Clinical Impression   Pt seen for OT evaluation this date to f/u re: safety with ADLs/ADL mobility. Pt reports being INDEP to MOD I (cane) at baseline with fxl mobility and ADLs. Pt presents this date with post-op pain and some decreased strength limiting her ability to safely perform ADLs. Pt currently requiring MOD/MAX A for LB ADLs d/t L knee pain and requires MIN A with RW for ADL transfers. OT engages pt and dtr in ed re: role of OT, compression stocking and polar care mgt, importance of OOB activity and exercise for healing and clot prevention, use of AE for LB ADLs, and bathing considerations. Pt and dtr with good understanding. Extended time spent answering questions and providing information re: therapy in [facility] versus rehabilitation facility. OT currently recommending STR in SNF setting as this is most prudent option given pt's current needs versus baseline.  Pt will require increased time with occupational therapy services to re-gain strength and to learn how to use AE/DME/AD to safely perform I/ADLs at her PLOF.      Follow Up Recommendations  SNF    Equipment Recommendations  3 in 1 bedside commode;Tub/shower seat;Other (comment) (2ww)    Recommendations for Other Services       Precautions / Restrictions Precautions Precautions: Fall Restrictions Weight Bearing Restrictions: Yes LLE Weight Bearing: Weight bearing as tolerated      Mobility Bed Mobility Overal bed mobility: Needs Assistance Bed Mobility: Supine to Sit     Supine to sit: Supervision     General bed mobility comments: pt up to chair pre/post session    Transfers Overall transfer level: Needs assistance Equipment used: Rolling walker (2 wheeled) Transfers: Sit to/from Stand Sit to  Stand: Min assist         General transfer comment: increased time, limited tolerance, cues for RW    Balance Overall balance assessment: Needs assistance Sitting-balance support: No upper extremity supported;Feet supported Sitting balance-Leahy Scale: Good     Standing balance support: Bilateral upper extremity supported Standing balance-Leahy Scale: Fair                             ADL either performed or assessed with clinical judgement   ADL                                         General ADL Comments: SETUP for seated UB ADLs, MOD/MAX A for LB ADLs.     Vision Patient Visual Report: No change from baseline       Perception     Praxis      Pertinent Vitals/Pain Pain Assessment: Faces Faces Pain Scale: Hurts little more Pain Location: L knee Pain Descriptors / Indicators: Aching;Grimacing;Guarding Pain Intervention(s): Limited activity within patient's tolerance;Monitored during session;Repositioned     Hand Dominance     Extremity/Trunk Assessment Upper Extremity Assessment Upper Extremity Assessment: Overall WFL for tasks assessed;Generalized weakness (ROM WFL, MMT grossly 4-/5)   Lower Extremity Assessment Lower Extremity Assessment: Defer to PT evaluation;Generalized weakness;LLE deficits/detail LLE: Unable to fully assess due to pain       Communication Communication Communication: No difficulties   Cognition Arousal/Alertness: Awake/alert  Behavior During Therapy: WFL for tasks assessed/performed Overall Cognitive Status: Within Functional Limits for tasks assessed                                 General Comments: limited insight into medical situation, functional needs and overall safety concerns. Follows commands appropriately, oriented and appropriate conversationally.   General Comments       Exercises  Other Exercises: OT engages pt and dtr in ed re: role of OT, compression stocking and polar  care mgt, importance of OOB activity and exercise for healing and clot prevention, use of AE for LB ADLs, and bathing considerations. Pt and dtr with good understanding.   Shoulder Instructions      Home Living Family/patient expects to be discharged to:: Private residence Living Arrangements: Alone Available Help at Discharge: Family;Available PRN/intermittently Type of Home: Other(Comment) (in-law suite built on her daughter's home) Home Access: Stairs to enter [employer] of Steps: 3 steps -> platform -> chair lift Entrance Stairs-Rails: Left Home Layout: One level               Home Equipment: Cane - single point          Prior Functioning/Environment Level of Independence: Independent        Comments: Mod indep for ADLs, household and limited community mobilization; intermittent use of SPC "on bad days" or when outside of home.  Does endorse multiple fall history.  No home O2.        OT Problem List: Decreased strength;Impaired balance (sitting and/or standing);Decreased range of motion;Decreased activity tolerance;Decreased knowledge of use of DME or AE;Cardiopulmonary status limiting activity;Pain      OT Treatment/Interventions: Self-care/ADL training;DME and/or AE instruction;Therapeutic activities;Balance training;Therapeutic exercise;Energy conservation;Patient/family education    OT Goals(Current goals can be found in the care plan section) Acute Rehab OT Goals Patient Stated Goal: "to get better so i can go home and see my family" OT Goal Formulation: With patient Time For Goal Achievement: 08/20/20 Potential to Achieve Goals: Good ADL Goals Pt Will Perform Lower Body Bathing: with min guard assist;with min assist;sit to/from stand (with AE) Pt Will Perform Lower Body Dressing: with min guard assist;with min assist (with AE PRN) Pt Will Transfer to Toilet: with min guard assist;ambulating (with LRAD to BAC over standard comode to elevate in  restroom) Pt Will Perform Toileting - Clothing Manipulation and hygiene: with min guard assist;sit to/from stand (wtih use of grab bars to stabilize) Pt/caregiver will Perform Home Exercise Program: Increased strength;Both right and left upper extremity;With minimal assist  OT Frequency: Min 1X/week   Barriers to D/C:            Co-evaluation              AM-PAC OT "6 Clicks" Daily Activity     Outcome Measure Help from another person eating meals?: None Help from another person taking care of personal grooming?: A Little Help from another person toileting, which includes using toliet, bedpan, or urinal?: A Lot Help from another person bathing (including washing, rinsing, drying)?: A Lot Help from another person to put on and taking off regular upper body clothing?: A Little Help from another person to put on and taking off regular lower body clothing?: A Lot 6 Click Score: 16   End of Session Equipment Utilized During Treatment: Gait belt;Rolling walker Nurse Communication: Mobility status  Activity Tolerance: Patient tolerated treatment well Patient left:  in chair;with call bell/phone within reach;with chair alarm set;with family/visitor present  OT Visit Diagnosis: Unsteadiness on feet (R26.81);Muscle weakness (generalized) (M62.81)                Time: 1747-1595 OT Time Calculation (min): 48 min Charges:  OT General Charges $OT Visit: 1 Visit OT Evaluation $OT Eval Moderate Complexity: 1 Mod OT Treatments $Self Care/Home Management : 23-37 mins $Therapeutic Activity: 8-22 mins  Gerrianne Scale, MS, OTR/L ascom [phone] 08/06/20, 5:46 PM

## 2020-08-06 NOTE — Plan of Care (Signed)
  Problem: Activity: Goal: Risk for activity intolerance will decrease Outcome: Progressing   Problem: Nutrition: Goal: Adequate nutrition will be maintained Outcome: Progressing   Problem: Elimination: Goal: Will not experience complications related to bowel motility Outcome: Progressing Goal: Will not experience complications related to urinary retention Outcome: Progressing   Problem: Pain Managment: Goal: General experience of comfort will improve Outcome: Progressing   Problem: Safety: Goal: Ability to remain free from injury will improve Outcome: Progressing   Problem: Activity: Goal: Ability to avoid complications of mobility impairment will improve Outcome: Progressing Goal: Range of joint motion will improve Outcome: Progressing   Problem: Clinical Measurements: Goal: Postoperative complications will be avoided or minimized Outcome: Progressing   Problem: Pain Management: Goal: Pain level will decrease with appropriate interventions Outcome: Progressing

## 2020-08-06 NOTE — Progress Notes (Signed)
Subjective: 3 Days Post-Op Procedure(s) (LRB): TOTAL KNEE ARTHROPLASTY (Left) Patient reports pain as mild to moderate.   Patient is well and improving with her blood pressure.  She has a CT angiogram ordered of her chest today to evaluate for a pulmonary embolus. PT and care management to assist with discharge planning, current plan is for SNF at this time. Negative for chest pain and shortness of breath Fever: no Gastrointestinal:Negative for nausea and vomiting Reports headache but feels that it is improving.  Objective: Vital signs in last 24 hours: Temp:  [98.3 F (36.8 C)-99.1 F (37.3 C)] 98.4 F (36.9 C) (04/01 0318) Pulse Rate:  [39-100] 96 (04/01 0318) Resp:  [16-20] 18 (04/01 0318) BP: (106-135)/(53-61) 132/59 (04/01 0318) SpO2:  [69 %-97 %] 91 % (04/01 0318)  Intake/Output from previous day:  Intake/Output Summary (Last 24 hours) at 08/06/2020 0649 Last data filed at 08/05/2020 1141 Gross per 24 hour  Intake 200 ml  Output --  Net 200 ml    Intake/Output this shift: No intake/output data recorded.  Labs: Recent Labs    08/03/20 0946 08/04/20 0511 08/05/20 0549 08/06/20 0514  HGB 12.9 9.7* 9.7* 8.7*   Recent Labs    08/05/20 0549 08/06/20 0514  WBC 10.4 9.5  RBC 3.51* 3.13*  HCT 31.4* 27.0*  PLT 206 204   Recent Labs    08/05/20 0549 08/06/20 0514  NA 130* 126*  K 5.2* 5.3*  CL 98 94*  CO2 26 27  BUN 26* 15  CREATININE 1.07* 0.74  GLUCOSE 107* 159*  CALCIUM 9.0 8.8*   No results for input(s): LABPT, INR in the last 72 hours.   EXAM General - Patient is Alert, Appropriate and Oriented Extremity - ABD soft Sensation intact distally Dorsiflexion/Plantar flexion intact Incision: dressing C/D/I No cellulitis present Dressing/Incision - clean, dry, no drainage to the left knee honeycomb dressing.  Ace wrap removed. Motor Function - intact, moving foot and toes well on exam.  Abdomen soft with normal bowel sounds.  Past Medical  History:  Diagnosis Date  . Colon polyps   . COPD (chronic obstructive pulmonary disease) ([place])   . Coronary artery disease   . GERD (gastroesophageal reflux disease)   . History of kidney stones   . Hyperlipidemia   . Hypertension   . LBBB (left bundle branch block)   . Pre-diabetes   . 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   Weight as of this encounter: 85.3 kg. Advance diet Up with therapy  Labs reviewed this AM. K+ 5.3, discontinued Klor con tablet, continue to monitor. Patient with improved hypotension , fluid bolus given yesterday, BP meds stopped.  Recent BP 132/59.  Scheduled for CT angiogram of her chest to evaluate for pulmonary embolus. History of hyponatremia, sodium tablets ordered. Hg 9.7 this AM. Continue with PT, patient is progressing slowly.  Current plan is for d/c to SNF.  Discharge planning to skilled nursing, although patient wants to go home.  Await CT results.  Possible discharge Saturday. Work on [employer] at this time.  DVT Prophylaxis - Lovenox, Foot Pumps and TED hose Weight-Bearing as tolerated  to left leg  Reche Dixon PA-C [facility] 08/06/2020, 6:49 AM

## 2020-08-06 NOTE — NC FL2 (Signed)
[place] LEVEL OF CARE SCREENING TOOL     IDENTIFICATION  Patient Name: Kimberly Woodward Birthdate: 1951-04-13 Sex: female Admission Date (Current Location): 08/03/2020  [place] and [place] Number:  Engineering geologist and Address:  [facility], [address], [place], [place] [address]      Provider Number: [id number]  Attending Physician Name and Address:  Corky Mull, MD  Relative Name and Phone Number:  Everardo Beals [phone]    Current Level of Care: Hospital Recommended Level of Care: [place] Prior Approval Number:    Date Approved/Denied:   PASRR Number: [id number]  Discharge Plan: SNF    Current Diagnoses: Patient Active Problem List   Diagnosis Date Noted  . Status post total knee replacement using cement, right 08/05/2020  . Status post total knee replacement using cement, left 08/03/2020  . Adrenal insufficiency (Addison's disease) ([place]) 08/08/2018  . Hyponatremia 08/02/2018  . Hyperkalemia 08/02/2018  . Acute kidney failure ([place]) 08/02/2018  . Obesity (BMI 35.0-39.9 without comorbidity) 01/15/2018  . Diabetes mellitus type 2, uncomplicated ([place]) 10/93/2355  . Bilateral carotid bruits 08/23/2017  . 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 Nausea & vomiting 08/17/2017  . CAD (coronary artery disease) 06/13/2016  . Essential hypertension 10/15/2013  . COPD (chronic obstructive pulmonary disease) ([place]) 10/15/2013  . Chest pain, unspecified 10/15/2013  . Family history of malignant neoplasm of gastrointestinal tract 07/23/2013  . Diarrhea 07/23/2013  . Benign neoplasm of colon 07/23/2013    Orientation RESPIRATION BLADDER Height & Weight     Self,Time,Situation,Place  Normal,O2 (3 liters) Continent Weight: 85.3 kg Height:  5\' 3"  (160 cm)  BEHAVIORAL SYMPTOMS/MOOD NEUROLOGICAL BOWEL NUTRITION STATUS      Continent Diet (regular)  AMBULATORY STATUS COMMUNICATION OF  NEEDS Skin   Extensive Assist Verbally                         Personal Care Assistance Level of Assistance  Bathing,Dressing Bathing Assistance: Limited assistance   Dressing Assistance: Limited assistance     Functional Limitations Info             SPECIAL CARE FACTORS FREQUENCY  PT (By licensed PT),OT (By licensed OT)     PT Frequency: 5 days per week OT Frequency: 3 days per week            Contractures Contractures Info: Not present    Additional Factors Info  Code Status,Allergies Code Status Info: Full Allergies Info: Penicillin, IVP DYe           Current Medications (08/06/2020):  This is the current hospital active medication list Current Facility-Administered Medications  Medication Dose Route Frequency Provider Last Rate Last Admin  . 0.9 %  sodium chloride infusion   Intravenous Continuous Poggi, Marshall Cork, MD 75 mL/hr at 08/04/20 0420 Infusion Verify at 08/04/20 0420  . acetaminophen (TYLENOL) tablet 325-650 mg  325-650 mg Oral Q6H PRN Corky Mull, MD   650 mg at 08/05/20 1416  . albuterol (PROVENTIL) (2.5 MG/3ML) 0.083% nebulizer solution 2.5 mg  2.5 mg Nebulization Q6H PRN Poggi, Marshall Cork, MD      . albuterol (VENTOLIN HFA) 108 (90 Base) MCG/ACT inhaler 1-2 puff  1-2 puff Inhalation Q6H PRN Poggi, Marshall Cork, MD      . ALPRAZolam Duanne Moron) tablet 0.25 mg  0.25 mg Oral QHS Poggi, Marshall Cork, MD   0.25 mg at 08/05/20 2206  . amLODipine (  NORVASC) tablet 10 mg  10 mg Oral Q1500 Poggi, Marshall Cork, MD      . aspirin chewable tablet 81 mg  81 mg Oral Daily Poggi, Marshall Cork, MD   81 mg at 08/06/20 1152  . atorvastatin (LIPITOR) tablet 10 mg  10 mg Oral Daily Poggi, Marshall Cork, MD   10 mg at 08/06/20 0943  . bisacodyl (DULCOLAX) suppository 10 mg  10 mg Rectal Daily PRN Poggi, Marshall Cork, MD      . carvedilol (COREG) tablet 6.25 mg  6.25 mg Oral BID WC Poggi, Marshall Cork, MD   6.25 mg at 08/06/20 0943  . cholecalciferol (VITAMIN D) tablet 1,000 Units  1,000 Units Oral Daily Poggi, Marshall Cork,  MD   1,000 Units at 08/06/20 [phone]  . diphenhydrAMINE (BENADRYL) 12.5 MG/5ML elixir 12.5-25 mg  12.5-25 mg Oral Q4H PRN Poggi, Marshall Cork, MD      . diphenhydrAMINE (BENADRYL) capsule 50 mg  50 mg Oral QHS Poggi, Marshall Cork, MD   50 mg at 08/04/20 2145  . docusate sodium (COLACE) capsule 100 mg  100 mg Oral BID Corky Mull, MD   100 mg at 08/06/20 0943  . DULoxetine (CYMBALTA) DR capsule 30 mg  30 mg Oral Daily Poggi, Marshall Cork, MD   30 mg at 08/06/20 0942  . enoxaparin (LOVENOX) injection 40 mg  40 mg Subcutaneous Q24H Poggi, Marshall Cork, MD   40 mg at 08/06/20 0944  . escitalopram (LEXAPRO) tablet 10 mg  10 mg Oral Daily Poggi, Marshall Cork, MD   10 mg at 08/06/20 0942  . gabapentin (NEURONTIN) capsule 300 mg  300 mg Oral QHS Poggi, Marshall Cork, MD   300 mg at 08/05/20 2206  . HYDROcodone-acetaminophen (NORCO/VICODIN) 5-325 MG per tablet 1-2 tablet  1-2 tablet Oral Q4H PRN Poggi, Marshall Cork, MD   2 tablet at 08/04/20 [phone]  . ipratropium (ATROVENT) nebulizer solution 0.5 mg  0.5 mg Nebulization Q4H PRN Poggi, Marshall Cork, MD      . magnesium hydroxide (MILK OF MAGNESIA) suspension 30 mL  30 mL Oral Daily PRN Poggi, Marshall Cork, MD   30 mL at 08/05/20 0843  . metoCLOPramide (REGLAN) tablet 5-10 mg  5-10 mg Oral Q8H PRN Poggi, Marshall Cork, MD       Or  . metoCLOPramide (REGLAN) injection 5-10 mg  5-10 mg Intravenous Q8H PRN Poggi, Marshall Cork, MD      . mometasone-formoterol (DULERA) 200-5 MCG/ACT inhaler 2 puff  2 puff Inhalation BID Poggi, Marshall Cork, MD   2 puff at 08/06/20 1019   And  . umeclidinium bromide (INCRUSE ELLIPTA) 62.5 MCG/INH 1 puff  1 puff Inhalation Daily Poggi, Marshall Cork, MD   1 puff at 08/06/20 1019  . morphine 2 MG/ML injection 0.5-1 mg  0.5-1 mg Intravenous Q2H PRN Poggi, Marshall Cork, MD      . multivitamin with minerals tablet 1 tablet  1 tablet Oral Daily Poggi, Marshall Cork, MD   1 tablet at 08/06/20 [phone]  . nitroGLYCERIN (NITROSTAT) SL tablet 0.4 mg  0.4 mg Sublingual Q5 Min x 3 PRN Poggi, Marshall Cork, MD      . ondansetron [facility]) tablet 4  mg  4 mg Oral Q6H PRN Poggi, Marshall Cork, MD   4 mg at 08/03/20 1801   Or  . ondansetron (ZOFRAN) injection 4 mg  4 mg Intravenous Q6H PRN Poggi, Marshall Cork, MD   4 mg at 08/04/20 1130  . pantoprazole (PROTONIX) EC tablet  40 mg  40 mg Oral Daily Poggi, Marshall Cork, MD   40 mg at 08/06/20 0942  . sodium chloride tablet 1 g  1 g Oral BID WC Lattie Corns, PA-C   1 g at 08/06/20 5726  . sodium phosphate (FLEET) 7-19 GM/118ML enema 1 enema  1 enema Rectal Once PRN Poggi, Marshall Cork, MD      . tamsulosin (FLOMAX) capsule 0.4 mg  0.4 mg Oral Daily Poggi, Marshall Cork, MD   0.4 mg at 08/06/20 1152  . traMADol (ULTRAM) tablet 50 mg  50 mg Oral Q6H PRN Poggi, Marshall Cork, MD   50 mg at 08/06/20 0942  . vitamin B-12 (CYANOCOBALAMIN) tablet 1,000 mcg  1,000 mcg Oral Daily Poggi, Marshall Cork, MD   1,000 mcg at 08/06/20 1153     Discharge Medications: Please see discharge summary for a list of discharge medications.  Relevant Imaging Results:  Relevant Lab Results:   Additional Information SS# [id number]  Su Hilt, RN

## 2020-08-06 NOTE — Progress Notes (Signed)
PT Cancellation Note  Patient Details Name: Kimberly Woodward MRN: [id number] DOB: [birth year]   Cancelled Treatment:    Reason Eval/Treat Not Completed: Medical issues which prohibited therapy (Chart reviewed for planned treatment session. Noted orders for pending CT to rule out PE.  Will hold treatment session until test complete and results received.)   Rushie Brazel H. Owens Shark, PT, DPT, NCS 08/06/20, 8:43 AM [phone]

## 2020-08-07 NOTE — Progress Notes (Signed)
OT Cancellation Note  Patient Details Name: Kimberly Woodward MRN: [id number] DOB: Sep 20, 1950   Cancelled Treatment:    Reason Eval/Treat Not Completed: Pain limiting ability to participate  Pt politely declines citing increased nerve pain (reports feeling a "burning sensation" to her B LEs). Attempted to call RN, notified CNA. Will f/u for OT treatment at later date/time as able. Thank you.  Gerrianne Scale, [place], OTR/L ascom [phone] 08/07/20, 6:51 PM

## 2020-08-07 NOTE — Progress Notes (Signed)
End of Shift Summary:  Date: 08/07/20 Shift: 0700-1500 Ambulatory: up with PT x1 Significant Events: Patient continues to c/o pain in nose from oxygen. Educated on keeping cannula looser than prior, humidity added. Per TOC note, patient will go to [facility], possibly on Monday.   Daughter Nevin Bloodgood requests patient go to [facility] rather than [facility].

## 2020-08-07 NOTE — Progress Notes (Signed)
PT Cancellation Note  Patient Details Name: Kimberly Woodward MRN: [id number] DOB: [birth year]   Cancelled Treatment:     PT attempt 2 x this morning. Pt does have elevated K+ however cleared for participation. She was unwilling. " I didn't sleep at all last night and just hurt too bad. Can you come back later?" Will continue efforts throughout the day today.    Willette Pa 08/07/2020, 10:58 AM

## 2020-08-07 NOTE — TOC Progression Note (Signed)
Transition of Care [facility]) - Progression Note    Patient Details  Name: Kimberly Woodward MRN: [id number] Date of Birth: 23-Jun-1950  Transition of Care [facility]) CM/SW [place], RN Phone Number: 08/07/2020, 11:02 AM  Clinical Narrative:   Patient chose to go to [facility], they will not accept until Monday Insurance auth started         Expected Discharge Plan and Services           Expected Discharge Date: 08/07/20                                     Social Determinants of Health (SDOH) Interventions    Readmission Risk Interventions No flowsheet data found.

## 2020-08-07 NOTE — Plan of Care (Signed)
  Problem: Elimination: Goal: Will not experience complications related to bowel motility Outcome: Progressing Goal: Will not experience complications related to urinary retention Outcome: Progressing   

## 2020-08-07 NOTE — TOC Progression Note (Signed)
Transition of Care [facility]) - Progression Note    Patient Details  Name: Kimberly Woodward MRN: [id number] Date of Birth: 1950/11/07  Transition of Care [facility]) CM/SW [place], RN Phone Number: 08/07/2020, 10:30 AM  Clinical Narrative:    Reviewed the bed choices with the patient, she requested that I call her Daughter, I called and left a VM with Estill Bamberg, requested a call back, CM started the insurance auth request and submitted clinical information thru the [facility] portal, used [facility] temporarily and will change it once the patient decides, awaiting approval        Expected Discharge Plan and Services           Expected Discharge Date: 08/07/20                                     Social Determinants of Health (SDOH) Interventions    Readmission Risk Interventions No flowsheet data found.

## 2020-08-07 NOTE — Progress Notes (Signed)
  Subjective: 4 Days Post-Op Procedure(s) (LRB): TOTAL KNEE ARTHROPLASTY (Left) Patient reports pain as mild to moderate.   Patient is well and improving with her blood pressure.  The patient had a CT angiogram that was negative for pulmonary embolus. PT and care management to assist with discharge planning, current plan is for SNF at this time. Negative for chest pain and shortness of breath Fever: no Gastrointestinal:Negative for nausea and vomiting Reports headache but feels that it is improving.  Objective: Vital signs in last 24 hours: Temp:  [98 F (36.7 C)-98.5 F (36.9 C)] 98.1 F (36.7 C) (04/02 0445) Pulse Rate:  [74-95] 74 (04/02 0445) Resp:  [15-18] 18 (04/02 0445) BP: (103-148)/(45-66) 103/65 (04/02 0445) SpO2:  [91 %-97 %] 91 % (04/02 0445)  Intake/Output from previous day: No intake or output data in the 24 hours ending 08/07/20 0543  Intake/Output this shift: No intake/output data recorded.  Labs: Recent Labs    08/05/20 0549 08/06/20 0514  HGB 9.7* 8.7*   Recent Labs    08/05/20 0549 08/06/20 0514  WBC 10.4 9.5  RBC 3.51* 3.13*  HCT 31.4* 27.0*  PLT 206 204   Recent Labs    08/05/20 0549 08/06/20 0514  NA 130* 126*  K 5.2* 5.3*  CL 98 94*  CO2 26 27  BUN 26* 15  CREATININE 1.07* 0.74  GLUCOSE 107* 159*  CALCIUM 9.0 8.8*   No results for input(s): LABPT, INR in the last 72 hours.   EXAM General - Patient is Alert, Appropriate and Oriented Extremity - ABD soft Sensation intact distally Dorsiflexion/Plantar flexion intact Incision: dressing C/D/I No cellulitis present Dressing/Incision - clean, dry, no drainage to the left knee honeycomb dressing.  Ace wrap removed. Motor Function - intact, moving foot and toes well on exam.  Ambulated 50 feet with physical therapy. Abdomen soft with normal bowel sounds.  Past Medical History:  Diagnosis Date  . Colon polyps   . COPD (chronic obstructive pulmonary disease) ([place])   . Coronary  artery disease   . GERD (gastroesophageal reflux disease)   . History of kidney stones   . Hyperlipidemia   . Hypertension   . LBBB (left bundle branch block)   . Pre-diabetes   . 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   Weight as of this encounter: 85.3 kg. Advance diet Up with therapy  Labs reviewed this AM. K+ 5.3, discontinued Klor con tablet, continue to monitor. Patient with improved hypotension , fluid bolus given yesterday, BP meds stopped.  Recent BP 132/59.  CT angiogram negative for pulmonary embolus. History of hyponatremia, sodium tablets ordered. Hg 8.7 this AM. Continue with PT, patient is progressing slowly.  Current plan is for d/c to SNF.  Plan for today.  Discharge planning to skilled nursing, although patient wants to go home.  Possible discharge today. Work on [employer] at this time.  DVT Prophylaxis - Lovenox, Foot Pumps and TED hose Weight-Bearing as tolerated to left leg  Reche Dixon PA-C [facility] 08/07/2020, 5:43 AM

## 2020-08-07 NOTE — Progress Notes (Signed)
Physical Therapy Treatment Patient Details Name: Kimberly Woodward MRN: [id number] DOB: 07-24-1950 Today's Date: 08/07/2020    History of Present Illness admitted for acute hospitalization s/p L TKR (08/03/20), WBAT    PT Comments    Pt was supine in bed with HOB elevated ~ 20 degrees. She agrees to PT session with encouragement. Supportive daughter was leaving upon arriving. Daughter requesting pt go to [facility] at [place] if able but is agreeable to [facility] if not. Pt was able to tolerate getting OOB and ambulating ~ 50 ft. Chief Strategy Officer issued HEP handout and pt performed. Was able to progress knee flexion to 92 degrees after several stretches. Overall pt is progressing however will benefit form SNF to continue skilled PT. Pt will benefit from continued skilled PT to address deficits with strength, ROM, and overall safe functional mobility.    Follow Up Recommendations  SNF     Equipment Recommendations  Other (comment) (defer to next level of care)       Precautions / Restrictions Precautions Precautions: Fall Restrictions Weight Bearing Restrictions: Yes LLE Weight Bearing: Weight bearing as tolerated    Mobility  Bed Mobility Overal bed mobility: Needs Assistance Bed Mobility: Supine to Sit     Supine to sit: Min assist Sit to supine: Min assist;Mod assist;HOB elevated   General bed mobility comments: Increased time required to exit and re-enter bed. Assisted LLE.    Transfers Overall transfer level: Needs assistance Equipment used: Rolling walker (2 wheeled) Transfers: Sit to/from Stand Sit to Stand: Min assist         General transfer comment: Min assist to STS form slightly elevated bed height. Vcs for proper technique and improved safety  Ambulation/Gait Ambulation/Gait assistance: Min guard Gait Distance (Feet): 50 Feet Assistive device: Rolling walker (2 wheeled) Gait Pattern/deviations: Step-to pattern;Antalgic Gait velocity: decreased   General Gait Details:  Pt was able to ambulate 50 ft with slow step to antalgic gait pattern. ON 2 L O2 throughout with sao2 > 92%. no LOB. pt unwilling to go further distances even when encouraged         Balance Overall balance assessment: Needs assistance Sitting-balance support: No upper extremity supported;Feet supported Sitting balance-Leahy Scale: Good     Standing balance support: Bilateral upper extremity supported;During functional activity Standing balance-Leahy Scale: Fair       Cognition Arousal/Alertness: Awake/alert Behavior During Therapy: WFL for tasks assessed/performed Overall Cognitive Status: Within Functional Limits for tasks assessed      General Comments: Pt was A and O x 4 and agreeable to PT session.      Exercises Total Joint Exercises Ankle Circles/Pumps: AROM;10 reps Quad Sets: AROM;10 reps Heel Slides: AROM;5 reps Hip ABduction/ADduction: AAROM;10 reps Straight Leg Raises: AAROM;10 reps Goniometric ROM: 92 degrees flexion after stretching exercises.        Pertinent Vitals/Pain Pain Assessment: 0-10 Pain Score: 7  Pain Location: L knee Pain Descriptors / Indicators: Aching;Grimacing;Guarding Pain Intervention(s): Limited activity within patient's tolerance;Monitored during session;Premedicated before session;Repositioned;Ice applied           PT Goals (current goals can now be found in the care plan section) Acute Rehab PT Goals Patient Stated Goal: Get through rehab so I can go home." Progress towards PT goals: Progressing toward goals    Frequency    BID      PT Plan Current plan remains appropriate   AM-PAC PT "6 Clicks" Mobility   Outcome Measure  Help needed turning from your back to your  side while in a flat bed without using bedrails?: None Help needed moving from lying on your back to sitting on the side of a flat bed without using bedrails?: A Little Help needed moving to and from a bed to a chair (including a wheelchair)?: A  Little Help needed standing up from a chair using your arms (e.g., wheelchair or bedside chair)?: A Little Help needed to walk in hospital room?: A Little Help needed climbing 3-5 steps with a railing? : A Lot 6 Click Score: 18    End of Session Equipment Utilized During Treatment: Gait belt Activity Tolerance: Patient tolerated treatment well Patient left: in bed;with call bell/phone within reach;with bed alarm set;with SCD's reapplied Nurse Communication: Mobility status PT Visit Diagnosis: Muscle weakness (generalized) (M62.81);Difficulty in walking, not elsewhere classified (R26.2);Pain Pain - Right/Left: Left Pain - part of body: Knee     Time: 1355-1435 PT Time Calculation (min) (ACUTE ONLY): 40 min  Charges:  $Gait Training: 8-22 mins $Therapeutic Exercise: 23-37 mins                     Julaine Fusi PTA 08/07/20, 2:48 PM

## 2020-08-08 MED ORDER — SODIUM POLYSTYRENE SULFONATE 15 GM/60ML PO SUSP
30.0000 g | Freq: Once | ORAL | Status: AC
  Administered 2020-08-08: 30 g via ORAL
  Filled 2020-08-08: qty 120

## 2020-08-08 NOTE — Progress Notes (Addendum)
Subjective: 5 Days Post-Op Procedure(s) (LRB): TOTAL KNEE ARTHROPLASTY (Left) Patient reports pain as mild to moderate.   Patient is well and improving with her blood pressure.  The patient had a CT angiogram that was negative for pulmonary embolus. PT and care management to assist with discharge planning, current plan is for SNF at this time. Negative for chest pain and shortness of breath Fever: no Gastrointestinal:Negative for nausea and vomiting Reports headache but feels that it is improving.  Objective: Vital signs in last 24 hours: Temp:  [97 F (36.1 C)-98.4 F (36.9 C)] 98.4 F (36.9 C) (04/03 0413) Pulse Rate:  [68-78] 72 (04/03 0413) Resp:  [16-18] 16 (04/03 0413) BP: (99-128)/(44-64) 121/64 (04/03 0413) SpO2:  [95 %-100 %] 98 % (04/03 0413)  Intake/Output from previous day:  Intake/Output Summary (Last 24 hours) at 08/08/2020 0703 Last data filed at 08/08/2020 0200 Gross per 24 hour  Intake 240 ml  Output 100 ml  Net 140 ml    Intake/Output this shift: No intake/output data recorded.  Labs: Recent Labs    08/06/20 0514  HGB 8.7*   Recent Labs    08/06/20 0514  WBC 9.5  RBC 3.13*  HCT 27.0*  PLT 204   Recent Labs    08/06/20 0514  NA 126*  K 5.3*  CL 94*  CO2 27  BUN 15  CREATININE 0.74  GLUCOSE 159*  CALCIUM 8.8*   No results for input(s): LABPT, INR in the last 72 hours.   EXAM General - Patient is Alert, Appropriate and Oriented Extremity - ABD soft Sensation intact distally Dorsiflexion/Plantar flexion intact Incision: dressing C/D/I No cellulitis present Dressing/Incision - clean, dry, no drainage to the left knee honeycomb dressing.   Motor Function - intact, moving foot and toes well on exam.  Ambulated 50 feet with physical therapy. Abdomen soft with normal bowel sounds.  Past Medical History:  Diagnosis Date  . Colon polyps   . COPD (chronic obstructive pulmonary disease) ([place])   . Coronary artery disease   . GERD  (gastroesophageal reflux disease)   . History of kidney stones   . Hyperlipidemia   . Hypertension   . LBBB (left bundle branch block)   . Pre-diabetes   . Subclavian arterial stenosis (HCC)    s/p innominate artery PTA    Assessment/Plan: 5 Days Post-Op Procedure(s) (LRB): TOTAL KNEE ARTHROPLASTY (Left) Active Problems:   Status post total knee replacement using cement, left   Status post total knee replacement using cement, right  Estimated body mass index is 33.3 kg/m as calculated from the following:   Height as of this encounter: 5\' 3"  (1.6 m).   Weight as of this encounter: 85.3 kg. Advance diet Up with therapy  Labs reviewed this AM. K+ 5.3, discontinued Klor con tablet, continue to monitor. Patient with improved hypotension , fluid bolus given yesterday, BP meds stopped.  Recent BP 132/59.  CT angiogram negative for pulmonary embolus. History of hyponatremia, sodium tablets ordered. Hg 8.7 this AM. Continue with PT, patient is progressing slowly.  Current plan is for d/c to SNF.  Plan for today.  Discharge planning to skilled nursing, although patient wants to go home.  Plan for discharge on Monday to rehab. Work on [employer] at this time.  DVT Prophylaxis - Lovenox, Foot Pumps and TED hose Weight-Bearing as tolerated to left leg  Reche Dixon PA-C [facility] 08/08/2020, 7:03 AM   Potassium elevated, KCl supplement dc'd Will give one dose of  Kayexalate. Recheck in am

## 2020-08-08 NOTE — Plan of Care (Signed)
  Problem: Education: Goal: Knowledge of General Education information will improve Description: Including pain rating scale, medication(s)/side effects and non-pharmacologic comfort measures Outcome: Progressing   Problem: Health Behavior/Discharge Planning: Goal: Ability to manage health-related needs will improve Outcome: Progressing   Problem: Pain Managment: Goal: General experience of comfort will improve Outcome: Progressing   Problem: Elimination: Goal: Will not experience complications related to bowel motility Outcome: Progressing   Problem: Coping: Goal: Level of anxiety will decrease Outcome: Progressing

## 2020-08-08 NOTE — Progress Notes (Signed)
No acute events overnight slept well throughout pain control on oral analgesics, dressing to left knee remains clean intact. Movement and sensation present distal to surgery site. Paolar ice in place. Ambulated once to [facility], voided freely. Vital signs on the softer side but stable observation continues.

## 2020-08-08 NOTE — Progress Notes (Addendum)
Physical Therapy Treatment Patient Details Name: Kimberly Woodward MRN: [id number] DOB: 18-Aug-1950 Today's Date: 08/08/2020    History of Present Illness admitted for acute hospitalization s/p L TKR (08/03/20), WBAT    PT Comments    Potassium noted to be 5.3 this am.  Discussed with Dr. Rudene Christians.  OK to proceed with session.  Pt continues to c/o inc pain today.  Premedicated before session 1 hour.  On commode upon arrival.  Assisted with care and transfer back to bed with min a x 1.  Encouraged ROM/stretching in sitting but pain is a barrier and she self limits.  Returned to supine per her request, declining to get to recliner, with mod a x 1 for BLE assist.  Supine ex.  HR stable in 80's with no c/o chest pain or irregular HR.   Session limited overall today by pain.  Encouragement provided.  Plan for SNF tomorrow.   Follow Up Recommendations  SNF     Equipment Recommendations       Recommendations for Other Services       Precautions / Restrictions Precautions Precautions: Fall Restrictions Weight Bearing Restrictions: Yes LLE Weight Bearing: Weight bearing as tolerated Other Position/Activity Restrictions: watch potassium    Mobility  Bed Mobility Overal bed mobility: Needs Assistance Bed Mobility: Sit to Supine       Sit to supine: Mod assist   General bed mobility comments: BLE assist    Transfers Overall transfer level: Needs assistance Equipment used: Rolling walker (2 wheeled) Transfers: Sit to/from Stand Sit to Stand: Min assist            Ambulation/Gait Ambulation/Gait assistance: Min guard Gait Distance (Feet): 3 Feet Assistive device: Rolling walker (2 wheeled) Gait Pattern/deviations: Step-to pattern;Antalgic Gait velocity: decreased   General Gait Details: limited commode to bed   Stairs             Wheelchair Mobility    Modified Rankin (Stroke Patients Only)       Balance Overall balance assessment: Needs  assistance Sitting-balance support: No upper extremity supported;Feet supported Sitting balance-Leahy Scale: Good     Standing balance support: Bilateral upper extremity supported;During functional activity Standing balance-Leahy Scale: Fair                              Cognition Arousal/Alertness: Awake/alert Behavior During Therapy: WFL for tasks assessed/performed Overall Cognitive Status: Within Functional Limits for tasks assessed                                        Exercises Total Joint Exercises Ankle Circles/Pumps: AROM;10 reps Quad Sets: AROM;10 reps Heel Slides: AROM;5 reps Hip ABduction/ADduction: AAROM;10 reps Straight Leg Raises: AAROM;10 reps Goniometric ROM: 80 degrees - self limited today due to pain    General Comments        Pertinent Vitals/Pain Pain Assessment: 0-10 Pain Score: 7  Pain Location: L knee Pain Descriptors / Indicators: Aching;Grimacing;Guarding Pain Intervention(s): Limited activity within patient's tolerance;Monitored during session;Premedicated before session;Ice applied    Home Living                      Prior Function            PT Goals (current goals can now be found in the care plan section) Progress towards PT goals: Not  progressing toward goals - comment    Frequency    BID      PT Plan Current plan remains appropriate    Co-evaluation              AM-PAC PT "6 Clicks" Mobility   Outcome Measure  Help needed turning from your back to your side while in a flat bed without using bedrails?: None Help needed moving from lying on your back to sitting on the side of a flat bed without using bedrails?: A Little Help needed moving to and from a bed to a chair (including a wheelchair)?: A Little Help needed standing up from a chair using your arms (e.g., wheelchair or bedside chair)?: A Little Help needed to walk in hospital room?: A Little Help needed climbing 3-5 steps  with a railing? : A Lot 6 Click Score: 18    End of Session Equipment Utilized During Treatment: Gait belt Activity Tolerance: Patient tolerated treatment well Patient left: in bed;with call bell/phone within reach;with bed alarm set;with SCD's reapplied Nurse Communication: Mobility status PT Visit Diagnosis: Muscle weakness (generalized) (M62.81);Difficulty in walking, not elsewhere classified (R26.2);Pain Pain - Right/Left: Left Pain - part of body: Knee     Time: 0375-4360 PT Time Calculation (min) (ACUTE ONLY): 9 min  Charges:  $Gait Training: 8-22 mins $Therapeutic Exercise: 8-22 mins                    Chesley Noon, PTA 08/08/20, 11:58 AM

## 2020-08-09 LAB — BASIC METABOLIC PANEL
Anion gap: 8 (ref 5–15)
BUN: 10 mg/dL (ref 8–23)
CO2: 31 mmol/L (ref 22–32)
Calcium: 8.5 mg/dL — ABNORMAL LOW (ref 8.9–10.3)
Chloride: 93 mmol/L — ABNORMAL LOW (ref 98–111)
Creatinine, Ser: 0.63 mg/dL (ref 0.44–1.00)
GFR, Estimated: >60 mL/min (ref >60)
Glucose, Bld: 105 mg/dL — ABNORMAL HIGH (ref 70–99)
Potassium: 3.8 mmol/L (ref 3.5–5.1)
Sodium: 132 mmol/L — ABNORMAL LOW (ref 135–145)

## 2020-08-09 LAB — SARS CORONAVIRUS 2 (TAT 6-24 HRS): SARS Coronavirus 2: NEGATIVE

## 2020-08-09 NOTE — Care Management Important Message (Signed)
Important Message  Patient Details  Name: Kimberly Woodward MRN: [id number] Date of Birth: March 13, 1951   Medicare Important Message Given:  Yes     Juliann Pulse A Tesa Meadors 08/09/2020, 9:27 AM

## 2020-08-09 NOTE — Discharge Planning (Signed)
IV removed.  DC packet completed to go to [facility], room 705.  Attempted to call report 2x, but no one picked up on floor.  Secretary took down return phone # to get to receiving RN.  [facility] to transport about 2pm.

## 2020-08-09 NOTE — TOC Transition Note (Signed)
Transition of Care [facility]) - CM/SW Discharge Note   Patient Details  Name: Kimberly Woodward MRN: [id number] Date of Birth: 1951-04-20  [facility] CM/SW Contact:  Su Hilt, RN Phone Number: 08/09/2020, 9:17 AM   Clinical Narrative:   Patient to Transport to [facility] today to room 705, [facility] will transport at 2 PM, Patient made aware of the room number, attempted to call her daughter but had to leave a message requesting a call back          Patient Goals and CMS Choice        Discharge Placement                       Discharge Plan and Services                                     Social Determinants of Health (SDOH) Interventions     Readmission Risk Interventions No flowsheet data found.

## 2020-08-09 NOTE — Progress Notes (Signed)
Called report to [facility] R[facility] accepted pt and is sending someone to get her. Assessment done. No issues stated for transfer

## 2020-08-14 ENCOUNTER — Emergency Department: Payer: Medicare HMO

## 2020-08-14 ENCOUNTER — Other Ambulatory Visit: Payer: Self-pay

## 2020-08-14 ENCOUNTER — Inpatient Hospital Stay
Admission: EM | Admit: 2020-08-14 | Discharge: 2020-08-17 | DRG: 641 | Disposition: A | Discharge status: Home Health | Payer: Medicare HMO | Source: Skilled Nursing Facility | Attending: Internal Medicine | Admitting: Internal Medicine

## 2020-08-14 DIAGNOSIS — J209 Acute bronchitis, unspecified: Secondary | ICD-10-CM | POA: Diagnosis present

## 2020-08-14 DIAGNOSIS — E785 Hyperlipidemia, unspecified: Secondary | ICD-10-CM | POA: Diagnosis present

## 2020-08-14 DIAGNOSIS — Z9981 Dependence on supplemental oxygen: Secondary | ICD-10-CM

## 2020-08-14 DIAGNOSIS — K219 Gastro-esophageal reflux disease without esophagitis: Secondary | ICD-10-CM | POA: Diagnosis present

## 2020-08-14 DIAGNOSIS — E860 Dehydration: Secondary | ICD-10-CM | POA: Diagnosis present

## 2020-08-14 DIAGNOSIS — E669 Obesity, unspecified: Secondary | ICD-10-CM | POA: Diagnosis present

## 2020-08-14 DIAGNOSIS — E878 Other disorders of electrolyte and fluid balance, not elsewhere classified: Secondary | ICD-10-CM | POA: Diagnosis present

## 2020-08-14 DIAGNOSIS — E871 Hypo-osmolality and hyponatremia: Principal | ICD-10-CM

## 2020-08-14 DIAGNOSIS — Z7902 Long term (current) use of antithrombotics/antiplatelets: Secondary | ICD-10-CM

## 2020-08-14 DIAGNOSIS — Z90710 Acquired absence of both cervix and uterus: Secondary | ICD-10-CM

## 2020-08-14 DIAGNOSIS — Z87442 Personal history of urinary calculi: Secondary | ICD-10-CM | POA: Diagnosis not present

## 2020-08-14 DIAGNOSIS — J440 Chronic obstructive pulmonary disease with acute lower respiratory infection: Secondary | ICD-10-CM

## 2020-08-14 DIAGNOSIS — Z87891 Personal history of nicotine dependence: Secondary | ICD-10-CM

## 2020-08-14 DIAGNOSIS — Z803 Family history of malignant neoplasm of breast: Secondary | ICD-10-CM | POA: Diagnosis not present

## 2020-08-14 DIAGNOSIS — F32A Depression, unspecified: Secondary | ICD-10-CM | POA: Diagnosis present

## 2020-08-14 DIAGNOSIS — Z96652 Presence of left artificial knee joint: Secondary | ICD-10-CM | POA: Diagnosis present

## 2020-08-14 DIAGNOSIS — J441 Chronic obstructive pulmonary disease with (acute) exacerbation: Secondary | ICD-10-CM

## 2020-08-14 DIAGNOSIS — J9611 Chronic respiratory failure with hypoxia: Secondary | ICD-10-CM | POA: Diagnosis present

## 2020-08-14 DIAGNOSIS — N3000 Acute cystitis without hematuria: Secondary | ICD-10-CM

## 2020-08-14 DIAGNOSIS — Z7982 Long term (current) use of aspirin: Secondary | ICD-10-CM

## 2020-08-14 DIAGNOSIS — Z9049 Acquired absence of other specified parts of digestive tract: Secondary | ICD-10-CM | POA: Diagnosis not present

## 2020-08-14 DIAGNOSIS — I447 Left bundle-branch block, unspecified: Secondary | ICD-10-CM | POA: Diagnosis present

## 2020-08-14 DIAGNOSIS — Z79899 Other long term (current) drug therapy: Secondary | ICD-10-CM

## 2020-08-14 DIAGNOSIS — E271 Primary adrenocortical insufficiency: Secondary | ICD-10-CM | POA: Diagnosis present

## 2020-08-14 DIAGNOSIS — Z86010 Personal history of colonic polyps: Secondary | ICD-10-CM | POA: Diagnosis not present

## 2020-08-14 DIAGNOSIS — Z6832 Body mass index (BMI) 32.0-32.9, adult: Secondary | ICD-10-CM

## 2020-08-14 DIAGNOSIS — I2510 Atherosclerotic heart disease of native coronary artery without angina pectoris: Secondary | ICD-10-CM | POA: Diagnosis present

## 2020-08-14 DIAGNOSIS — Z9861 Coronary angioplasty status: Secondary | ICD-10-CM | POA: Diagnosis not present

## 2020-08-14 DIAGNOSIS — I10 Essential (primary) hypertension: Secondary | ICD-10-CM | POA: Diagnosis present

## 2020-08-14 DIAGNOSIS — E119 Type 2 diabetes mellitus without complications: Secondary | ICD-10-CM | POA: Diagnosis present

## 2020-08-14 DIAGNOSIS — Z91041 Radiographic dye allergy status: Secondary | ICD-10-CM

## 2020-08-14 DIAGNOSIS — Z880 Allergy status to penicillin: Secondary | ICD-10-CM

## 2020-08-14 DIAGNOSIS — J432 Centrilobular emphysema: Secondary | ICD-10-CM | POA: Diagnosis present

## 2020-08-14 DIAGNOSIS — D649 Anemia, unspecified: Secondary | ICD-10-CM | POA: Diagnosis present

## 2020-08-14 LAB — CBC WITH DIFFERENTIAL/PLATELET
Abs Immature Granulocytes: 0.14 10*3/uL — ABNORMAL HIGH (ref 0.00–0.07)
Basophils Absolute: 0 10*3/uL (ref 0.0–0.1)
Basophils Relative: 0 %
Eosinophils Absolute: 0.1 10*3/uL (ref 0.0–0.5)
Eosinophils Relative: 1 %
HCT: 28.5 % — ABNORMAL LOW (ref 36.0–46.0)
Hemoglobin: 9.4 g/dL — ABNORMAL LOW (ref 12.0–15.0)
Immature Granulocytes: 2 %
Lymphocytes Relative: 14 %
Lymphs Abs: 1.1 10*3/uL (ref 0.7–4.0)
MCH: 27.6 pg (ref 26.0–34.0)
MCHC: 33 g/dL (ref 30.0–36.0)
MCV: 83.6 fL (ref 80.0–100.0)
Monocytes Absolute: 0.8 10*3/uL (ref 0.1–1.0)
Monocytes Relative: 11 %
Neutro Abs: 5.7 10*3/uL (ref 1.7–7.7)
Neutrophils Relative %: 72 %
Platelets: 501 10*3/uL — ABNORMAL HIGH (ref 150–400)
RBC: 3.41 MIL/uL — ABNORMAL LOW (ref 3.87–5.11)
RDW: 13.9 % (ref 11.5–15.5)
WBC: 7.9 10*3/uL (ref 4.0–10.5)
nRBC: 0 % (ref 0.0–0.2)

## 2020-08-14 LAB — COMPREHENSIVE METABOLIC PANEL
ALT: 23 U/L (ref 0–44)
AST: 32 U/L (ref 15–41)
Albumin: 3.2 g/dL — ABNORMAL LOW (ref 3.5–5.0)
Alkaline Phosphatase: 83 U/L (ref 38–126)
Anion gap: 10 (ref 5–15)
BUN: 10 mg/dL (ref 8–23)
CO2: 25 mmol/L (ref 22–32)
Calcium: 8.6 mg/dL — ABNORMAL LOW (ref 8.9–10.3)
Chloride: 85 mmol/L — ABNORMAL LOW (ref 98–111)
Creatinine, Ser: 0.72 mg/dL (ref 0.44–1.00)
GFR, Estimated: >60 mL/min (ref >60)
Glucose, Bld: 108 mg/dL — ABNORMAL HIGH (ref 70–99)
Potassium: 4.7 mmol/L (ref 3.5–5.1)
Sodium: 120 mmol/L — ABNORMAL LOW (ref 135–145)
Total Bilirubin: 1.3 mg/dL — ABNORMAL HIGH (ref 0.3–1.2)
Total Protein: 6.9 g/dL (ref 6.5–8.1)

## 2020-08-14 LAB — URINALYSIS, COMPLETE (UACMP) WITH MICROSCOPIC
Bilirubin Urine: NEGATIVE
Glucose, UA: NEGATIVE mg/dL
Ketones, ur: NEGATIVE mg/dL
Nitrite: POSITIVE — AB
Protein, ur: NEGATIVE mg/dL
Specific Gravity, Urine: 1.008 (ref 1.005–1.030)
pH: 8 (ref 5.0–8.0)

## 2020-08-14 LAB — OSMOLALITY, URINE: Osmolality, Ur: 311 mOsm/kg (ref 300–900)

## 2020-08-14 LAB — TROPONIN I (HIGH SENSITIVITY)
Troponin I (High Sensitivity): 4 ng/L (ref <18)
Troponin I (High Sensitivity): 4 ng/L (ref <18)

## 2020-08-14 LAB — NA AND K (SODIUM & POTASSIUM), RAND UR
Potassium Urine: 41 mmol/L
Sodium, Ur: 74 mmol/L

## 2020-08-14 LAB — D-DIMER, QUANTITATIVE: D-Dimer, Quant: 3.27 ug/mL-FEU — ABNORMAL HIGH (ref 0.00–0.50)

## 2020-08-14 LAB — TSH: TSH: 1.663 u[IU]/mL (ref 0.350–4.500)

## 2020-08-14 MED ORDER — TAMSULOSIN HCL 0.4 MG PO CAPS
0.4000 mg | ORAL_CAPSULE | Freq: Every day | ORAL | Status: DC
  Administered 2020-08-15 – 2020-08-17 (×3): 0.4 mg via ORAL
  Filled 2020-08-14 (×3): qty 1

## 2020-08-14 MED ORDER — ONDANSETRON HCL 4 MG/2ML IJ SOLN: 4.0000 mg | Freq: Four times a day (QID) | INTRAMUSCULAR | Status: DC | PRN

## 2020-08-14 MED ORDER — ALBUTEROL SULFATE (2.5 MG/3ML) 0.083% IN NEBU
2.5000 mg | INHALATION_SOLUTION | Freq: Once | RESPIRATORY_TRACT | Status: AC
  Administered 2020-08-14: 2.5 mg via RESPIRATORY_TRACT
  Filled 2020-08-14: qty 3

## 2020-08-14 MED ORDER — IPRATROPIUM BROMIDE 0.02 % IN SOLN: 0.5000 mg | RESPIRATORY_TRACT | Status: DC | PRN

## 2020-08-14 MED ORDER — LEVOFLOXACIN IN D5W 750 MG/150ML IV SOLN
750.0000 mg | Freq: Once | INTRAVENOUS | Status: AC
  Administered 2020-08-14: 750 mg via INTRAVENOUS
  Filled 2020-08-14: qty 150

## 2020-08-14 MED ORDER — CLOPIDOGREL BISULFATE 75 MG PO TABS
75.0000 mg | ORAL_TABLET | Freq: Every day | ORAL | Status: DC
  Administered 2020-08-15 – 2020-08-17 (×3): 75 mg via ORAL
  Filled 2020-08-14 (×3): qty 1

## 2020-08-14 MED ORDER — ALPRAZOLAM 0.5 MG PO TABS
0.2500 mg | ORAL_TABLET | Freq: Every day | ORAL | Status: DC
  Administered 2020-08-14 – 2020-08-16 (×3): 0.25 mg via ORAL
  Filled 2020-08-14 (×3): qty 1

## 2020-08-14 MED ORDER — ENOXAPARIN SODIUM 60 MG/0.6ML ~~LOC~~ SOLN
0.5000 mg/kg | SUBCUTANEOUS | Status: DC
  Administered 2020-08-15 – 2020-08-16 (×2): 42.5 mg via SUBCUTANEOUS
  Filled 2020-08-14 (×3): qty 0.6

## 2020-08-14 MED ORDER — ENOXAPARIN SODIUM 40 MG/0.4ML ~~LOC~~ SOLN: 40.0000 mg | SUBCUTANEOUS | Status: DC

## 2020-08-14 MED ORDER — SODIUM CHLORIDE 0.9 % IV SOLN: 1.0000 g | INTRAVENOUS | Status: DC

## 2020-08-14 MED ORDER — DIPHENHYDRAMINE HCL 50 MG/ML IJ SOLN
50.0000 mg | Freq: Once | INTRAMUSCULAR | Status: AC
  Administered 2020-08-14: 50 mg via INTRAVENOUS
  Filled 2020-08-14 (×2): qty 1

## 2020-08-14 MED ORDER — ASPIRIN 81 MG PO CHEW
81.0000 mg | CHEWABLE_TABLET | Freq: Every day | ORAL | Status: DC
  Administered 2020-08-15 – 2020-08-17 (×3): 81 mg via ORAL
  Filled 2020-08-14 (×3): qty 1

## 2020-08-14 MED ORDER — SODIUM CHLORIDE 0.9 % IV SOLN: INTRAVENOUS | Status: DC

## 2020-08-14 MED ORDER — GABAPENTIN 300 MG PO CAPS
300.0000 mg | ORAL_CAPSULE | Freq: Every day | ORAL | Status: DC
  Administered 2020-08-15 – 2020-08-16 (×2): 300 mg via ORAL
  Filled 2020-08-14 (×3): qty 1

## 2020-08-14 MED ORDER — ACETAMINOPHEN 650 MG RE SUPP: 650.0000 mg | Freq: Four times a day (QID) | RECTAL | Status: DC | PRN

## 2020-08-14 MED ORDER — ONDANSETRON HCL 4 MG PO TABS: 4.0000 mg | ORAL_TABLET | Freq: Four times a day (QID) | ORAL | Status: DC | PRN

## 2020-08-14 MED ORDER — IOHEXOL 350 MG/ML SOLN
75.0000 mL | Freq: Once | INTRAVENOUS | Status: AC | PRN
  Administered 2020-08-14: 75 mL via INTRAVENOUS

## 2020-08-14 MED ORDER — ACETAMINOPHEN 325 MG PO TABS
650.0000 mg | ORAL_TABLET | Freq: Four times a day (QID) | ORAL | Status: DC | PRN
  Administered 2020-08-14 – 2020-08-16 (×5): 650 mg via ORAL
  Filled 2020-08-14 (×5): qty 2

## 2020-08-14 MED ORDER — ALBUTEROL SULFATE HFA 108 (90 BASE) MCG/ACT IN AERS: 1.0000 | INHALATION_SPRAY | Freq: Four times a day (QID) | RESPIRATORY_TRACT | Status: DC | PRN

## 2020-08-14 MED ORDER — PANTOPRAZOLE SODIUM 40 MG PO TBEC
40.0000 mg | DELAYED_RELEASE_TABLET | Freq: Every day | ORAL | Status: DC
  Administered 2020-08-15 – 2020-08-17 (×3): 40 mg via ORAL
  Filled 2020-08-14 (×3): qty 1

## 2020-08-14 MED ORDER — POTASSIUM CHLORIDE CRYS ER 10 MEQ PO TBCR
20.0000 meq | EXTENDED_RELEASE_TABLET | Freq: Every day | ORAL | Status: DC
  Administered 2020-08-15: 20 meq via ORAL
  Filled 2020-08-14: qty 2

## 2020-08-14 MED ORDER — ESCITALOPRAM OXALATE 10 MG PO TABS
10.0000 mg | ORAL_TABLET | Freq: Every day | ORAL | Status: DC
  Administered 2020-08-15 – 2020-08-17 (×3): 10 mg via ORAL
  Filled 2020-08-14 (×3): qty 1

## 2020-08-14 MED ORDER — ISOSORBIDE MONONITRATE ER 60 MG PO TB24: 30.0000 mg | ORAL_TABLET | Freq: Every morning | ORAL | Status: DC

## 2020-08-14 MED ORDER — DIPHENHYDRAMINE HCL 25 MG PO CAPS
50.0000 mg | ORAL_CAPSULE | Freq: Every day | ORAL | Status: DC
  Administered 2020-08-14: 23:00:00 50 mg via ORAL
  Filled 2020-08-14: qty 2

## 2020-08-14 MED ORDER — MAGNESIUM HYDROXIDE 400 MG/5ML PO SUSP
30.0000 mL | Freq: Every day | ORAL | Status: DC | PRN
  Filled 2020-08-14: qty 30

## 2020-08-14 MED ORDER — AMLODIPINE BESYLATE 5 MG PO TABS
10.0000 mg | ORAL_TABLET | Freq: Every day | ORAL | Status: DC
  Filled 2020-08-14: qty 2

## 2020-08-14 MED ORDER — SODIUM CHLORIDE 0.9 % IV BOLUS
1000.0000 mL | Freq: Once | INTRAVENOUS | Status: AC
  Administered 2020-08-14: 1000 mL via INTRAVENOUS

## 2020-08-14 MED ORDER — ALBUTEROL SULFATE (2.5 MG/3ML) 0.083% IN NEBU: 2.5000 mg | INHALATION_SOLUTION | Freq: Four times a day (QID) | RESPIRATORY_TRACT | Status: DC | PRN

## 2020-08-14 MED ORDER — TRAMADOL HCL 50 MG PO TABS
50.0000 mg | ORAL_TABLET | Freq: Four times a day (QID) | ORAL | Status: DC | PRN
  Administered 2020-08-15 – 2020-08-17 (×6): 100 mg via ORAL
  Filled 2020-08-14 (×6): qty 2

## 2020-08-14 MED ORDER — IPRATROPIUM-ALBUTEROL 0.5-2.5 (3) MG/3ML IN SOLN
3.0000 mL | Freq: Four times a day (QID) | RESPIRATORY_TRACT | Status: DC
  Administered 2020-08-15 – 2020-08-17 (×9): 3 mL via RESPIRATORY_TRACT
  Filled 2020-08-14 (×9): qty 3

## 2020-08-14 MED ORDER — TRAZODONE HCL 50 MG PO TABS
25.0000 mg | ORAL_TABLET | Freq: Every evening | ORAL | Status: DC | PRN
  Administered 2020-08-14 – 2020-08-16 (×3): 25 mg via ORAL
  Filled 2020-08-14 (×3): qty 1

## 2020-08-14 MED ORDER — VITAMIN B-12 1000 MCG PO TABS
1000.0000 ug | ORAL_TABLET | Freq: Every day | ORAL | Status: DC
  Administered 2020-08-15 – 2020-08-17 (×3): 1000 ug via ORAL
  Filled 2020-08-14 (×3): qty 1

## 2020-08-14 MED ORDER — VITAMIN D 25 MCG (1000 UNIT) PO TABS
1000.0000 [IU] | ORAL_TABLET | Freq: Every day | ORAL | Status: DC
  Administered 2020-08-15 – 2020-08-17 (×3): 1000 [IU] via ORAL
  Filled 2020-08-14 (×3): qty 1

## 2020-08-14 MED ORDER — DIPHENHYDRAMINE HCL 25 MG PO CAPS: 50.0000 mg | ORAL_CAPSULE | Freq: Once | ORAL | Status: AC

## 2020-08-14 MED ORDER — NITROGLYCERIN 0.4 MG SL SUBL: 0.4000 mg | SUBLINGUAL_TABLET | SUBLINGUAL | Status: DC | PRN

## 2020-08-14 MED ORDER — ADULT MULTIVITAMIN W/MINERALS CH
1.0000 | ORAL_TABLET | Freq: Every day | ORAL | Status: DC
  Administered 2020-08-15 – 2020-08-17 (×3): 1 via ORAL
  Filled 2020-08-14 (×3): qty 1

## 2020-08-14 MED ORDER — CARVEDILOL 6.25 MG PO TABS
6.2500 mg | ORAL_TABLET | Freq: Two times a day (BID) | ORAL | Status: DC
  Administered 2020-08-15 – 2020-08-16 (×3): 6.25 mg via ORAL
  Filled 2020-08-14 (×3): qty 1

## 2020-08-14 MED ORDER — DULOXETINE HCL 30 MG PO CPEP
30.0000 mg | ORAL_CAPSULE | Freq: Every day | ORAL | Status: DC
  Administered 2020-08-15 – 2020-08-17 (×3): 30 mg via ORAL
  Filled 2020-08-14 (×3): qty 1

## 2020-08-14 MED ORDER — ATORVASTATIN CALCIUM 20 MG PO TABS
10.0000 mg | ORAL_TABLET | Freq: Every day | ORAL | Status: DC
  Administered 2020-08-15 – 2020-08-17 (×3): 10 mg via ORAL
  Filled 2020-08-14 (×3): qty 1

## 2020-08-14 MED ORDER — SODIUM CHLORIDE 0.9 % IV BOLUS
500.0000 mL | Freq: Once | INTRAVENOUS | Status: AC
  Administered 2020-08-14: 500 mL via INTRAVENOUS

## 2020-08-14 MED ORDER — HYDROCORTISONE NA SUCCINATE PF 250 MG IJ SOLR
200.0000 mg | Freq: Once | INTRAMUSCULAR | Status: AC
  Administered 2020-08-14: 200 mg via INTRAVENOUS
  Filled 2020-08-14: qty 200

## 2020-08-14 NOTE — ED Notes (Signed)
Patient transported to X-ray 

## 2020-08-14 NOTE — ED Notes (Signed)
Patient is resting comfortably. Call within reach. Fall precautions in place. Patients bedding and brief changed.

## 2020-08-14 NOTE — Progress Notes (Signed)
PHARMACIST - PHYSICIAN COMMUNICATION  CONCERNING:  Enoxaparin (Lovenox) for DVT Prophylaxis    RECOMMENDATION: Patient was prescribed enoxaprin 40mg  q24 hours for VTE prophylaxis.   Filed Weights   08/14/20 1432  Weight: 82.6 kg (182 lb)    Body mass index is 32.24 kg/m.  Estimated Creatinine Clearance: 67.6 mL/min (by C-G formula based on SCr of 0.72 mg/dL).   Based on [place] patient is candidate for enoxaparin 0.5mg /kg TBW SQ every 24 hours based on BMI being >30.  DESCRIPTION: Pharmacy has adjusted enoxaparin dose per [facility] policy.  Patient is now receiving enoxaparin 0.5 mg/kg every 24 hours   Renda Rolls, PharmD, [facility] 08/14/2020 10:21 PM

## 2020-08-14 NOTE — H&P (Addendum)
[name]   PATIENT NAME: Kimberly Woodward    MR#:  [id number]  DATE OF BIRTH:  1950/06/23  DATE OF ADMISSION:  08/14/2020  PRIMARY CARE PHYSICIAN: [place], [place] [facility]   Patient is coming from: [facility] skilled nursing facility.  REQUESTING/REFERRING PHYSICIAN:    Cuthriell, Charline Bills, PA-C  CHIEF COMPLAINT:   Chief Complaint  Patient presents with  . Abnormal Lab  . Shortness of Breath    HISTORY OF PRESENT ILLNESS:  Kimberly Woodward is a 70 y.o. Caucasian female with medical history significant for COPD, hypertension, dyslipidemia, GERD and left bundle branch block who presents from her rehabilitation facility where she has been since 08/10/2020 after having left total knee arthroplasty, for hyponatremia with a sodium of 124 for further evaluation.  She admits to generalized fatigue and tiredness.  She has been having mild dyspnea without cough or wheezing.  She admits to chills without fever.  She has been having migraine headache.  She also admits to nausea without vomiting or diarrhea.  She has been having dysuria for the last 3 days with urinary urgency without frequency or flank pain or hematuria.  No chest pain or palpitations.  ED Course: Upon presentation to the emergency room her vital signs were within normal.  Labs revealed hyponatremia 120 with hypochloremia 85, total bili 1.3 and albumin of 3.2 with total protein of 6.9 with otherwise unremarkable CMP.  High-sensitivity troponin I was 4 twice.  CBC showed anemia with hemoglobin of 9.4 and hematocrit of 28.5 better than previous levels few days ago.  The patient's D-dimer came back 3.27.  Urinalysis was was suspicious for UTI with positive nitrite.  Urine culture was sent  Imaging: Two-view chest ray showed stable mild cardiomegaly and mild changes of chronic bronchitis and/or asthma with no acute cardiopulmonary disease.  The patient was premanaged for IV contrast allergy and a chest CTA was  performed revealing the following: 1. Negative examination for pulmonary embolism. 2. Moderate centrilobular emphysema with diffuse bilateral bronchial wall thickening, consistent with nonspecific infectious or inflammatory bronchitis. 3. Coronary artery disease.  The patient was given nebulized albuterol, 750 mg of IV Levaquin and 1.5 L of IV normal saline bolus in addition to premanagement for IV dye allergy with 50 mg of p.o. and 50 mg of IV Benadryl as well as 200 mg IV Solu-Cortef.  She will be admitted to a medical monitored bed for further evaluation and management. PAST MEDICAL HISTORY:   Past Medical History:  Diagnosis Date  . Colon polyps   . COPD (chronic obstructive pulmonary disease) ([place])   . Coronary artery disease   . GERD (gastroesophageal reflux disease)   . History of kidney stones   . Hyperlipidemia   . Hypertension   . LBBB (left bundle branch block)   . Pre-diabetes   . 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 ABDOMINAL HYSTERECTOMY    . BREAST BIOPSY Right    neg  . CARDIAC CATHETERIZATION Left 03/01/2016   Procedure: Left Heart Cath and Coronary Angiography;  Surgeon: Yolonda Kida, MD;  Location: [facility];  Service: Cardiovascular;  Laterality: Left;  . CHOLECYSTECTOMY    . HERNIA REPAIR    . KNEE ARTHROSCOPY WITH MEDIAL MENISECTOMY Left 07/15/2019   Procedure: KNEE ARTHROSCOPY WITH DEBRIDEMENT AND REPAIR VERSUS PATIAL MEDIAL AND LATERAL MENISECTOMY;  Surgeon: Corky Mull, MD;  Location: [facility];  Service: Orthopedics;  Laterality: Left;  . TONSILLECTOMY    . TOTAL KNEE ARTHROPLASTY Left 08/03/2020   Procedure: TOTAL KNEE ARTHROPLASTY;  Surgeon: Corky Mull, MD;  Location: [facility];  Service: Orthopedics;  Laterality: Left;    SOCIAL HISTORY:   Social History   Tobacco Use  . Smoking status: Former Smoker    Packs/day:  0.50    Years: 50.00    Pack years: 25.00    Types: Cigarettes    Quit date: 03/02/2015    Years since quitting: 5.4  . Smokeless tobacco: Never Used  Substance Use Topics  . Alcohol use: No    FAMILY HISTORY:   Family History  Problem Relation Age of Onset  . Breast cancer Maternal Aunt   . Breast cancer Paternal Aunt     DRUG ALLERGIES:   Allergies  Allergen Reactions  . Ivp Dye [Iodinated Diagnostic Agents] Hives  . Penicillins Hives    Did it involve swelling of the face/tongue/throat, SOB, or low BP? Unknown Did it involve sudden or severe rash/hives, skin peeling, or any reaction on the inside of your mouth or nose? Yes Did you need to seek medical attention at a hospital or doctor's office? Patient was inpatient when incident occurred When did it last happen?More than 15 years ago If all above answers are "NO", may proceed with cephalosporin use.     REVIEW OF SYSTEMS:   ROS As per history of present illness. All pertinent systems were reviewed above. Constitutional, HEENT, cardiovascular, respiratory, GI, GU, musculoskeletal, neuro, psychiatric, endocrine, integumentary and hematologic systems were reviewed and are otherwise negative/unremarkable except for positive findings mentioned above in the HPI.   MEDICATIONS AT HOME:   Prior to Admission medications   Medication Sig Start Date End Date Taking? Authorizing Provider  albuterol (PROVENTIL) (2.5 MG/3ML) 0.083% nebulizer solution Take 2.5 mg by nebulization every 6 (six) hours as needed for wheezing or shortness of breath.    [provider]  albuterol (VENTOLIN HFA) 108 (90 Base) MCG/ACT inhaler Inhale 1-2 puffs into the lungs every 6 (six) hours as needed (wheezing/shortness of breath).     [provider]  ALPRAZolam Duanne Moron) 0.25 MG tablet Take 0.25 mg by mouth at bedtime.  09/26/17   [provider]  amLODipine (NORVASC) 10 MG tablet Take 10 mg by mouth daily in the afternoon.   01/02/19   [provider]  aspirin 81 MG chewable tablet Chew 81 mg by mouth daily.     [provider]  atorvastatin (LIPITOR) 10 MG tablet Take 10 mg by mouth daily. 01/02/19   [provider]  Budeson-Glycopyrrol-Formoterol (BREZTRI AEROSPHERE) 160-9-4.8 MCG/ACT AERO Inhale into the lungs.    [provider]  carvedilol (COREG) 6.25 MG tablet Take 6.25 mg by mouth in the morning and at bedtime.  05/29/19   [provider]  cholecalciferol (VITAMIN D) 25 MCG (1000 UNIT) tablet Take 1,000 Units by mouth daily.    [provider]  clopidogrel (PLAVIX) 75 MG tablet Take 75 mg by mouth daily. 01/08/19   [provider]  diphenhydrAMINE (BENADRYL) 50 MG tablet Take 50 mg by mouth at bedtime.  02/29/16   [provider]  DULoxetine (CYMBALTA) 30 MG capsule Take 30 mg by mouth daily. 03/01/19   [provider]  enoxaparin (LOVENOX) 40 MG/0.4ML injection Inject 0.4 mLs (40 mg total) into the skin daily. 08/04/20   Lattie Corns,  PA-C  escitalopram (LEXAPRO) 10 MG tablet Take 10 mg by mouth daily. 01/27/19   [provider]  esomeprazole (NEXIUM) 40 MG capsule Take 40 mg by mouth daily.  03/18/18   [provider]  furosemide (LASIX) 20 MG tablet Take 20 mg by mouth every evening.  01/01/19 01/01/20  [provider]  gabapentin (NEURONTIN) 300 MG capsule Take 300 mg by mouth at bedtime.  09/16/18   [provider]  HYDROcodone-acetaminophen (NORCO/VICODIN) 5-325 MG tablet Take 1-2 tablets by mouth every 4 (four) hours as needed for moderate pain. 08/04/20   Lattie Corns, PA-C  hydrocortisone (CORTEF) 10 MG tablet Take 10 mg by mouth in the morning and at bedtime.  Patient not taking: No sig reported 03/01/19   [provider]  ipratropium (ATROVENT) 0.02 % nebulizer solution Take 0.5 mg by nebulization every 4 (four) hours as needed for wheezing or shortness of breath.     [provider]  isosorbide mononitrate (IMDUR) 30 MG 24 hr tablet Take 30 mg by mouth every morning. Patient not taking: No sig reported 12/19/18   [provider]  Multiple Vitamin (MULTIVITAMIN WITH MINERALS) TABS tablet Take 1 tablet by mouth daily.    [provider]  nitroGLYCERIN (NITROSTAT) 0.4 MG SL tablet Place 0.4 mg under the tongue every 5 (five) minutes x 3 doses as needed for chest pain.  05/15/18 07/26/20  [provider]  potassium chloride (KLOR-CON) 10 MEQ tablet Take 20 mEq by mouth daily.  11/01/18 07/26/20  [provider]  Protection 2.5-2.5 MCG/ACT AERS  07/01/19   [provider]  tamsulosin (FLOMAX) 0.4 MG CAPS capsule Take 1 capsule (0.4 mg total) by mouth daily. 03/10/19   Stoioff, Ronda Fairly, MD  traMADol (ULTRAM) 50 MG tablet Take 1-2 tablets (50-100 mg total) by mouth every 6 (six) hours as needed for moderate pain. 08/04/20   Lattie Corns, PA-C  triamterene-hydrochlorothiazide (MAXZIDE-25) 37.5-25 MG tablet Take 1 tablet by mouth daily. 01/17/19   [provider]  umeclidinium-vilanterol (ANORO ELLIPTA) 62.5-25 MCG/INH AEPB Inhale 1 puff into the lungs in the morning, at noon, and at bedtime.  Patient not taking: No sig reported 03/26/18   [provider]  vitamin B-12 (CYANOCOBALAMIN) 1000 MCG tablet Take 1,000 mcg by mouth daily.    [provider]      VITAL SIGNS:  Blood pressure 126/61, pulse 87, temperature 98 F (36.7 C), temperature source Oral, resp. rate 17, height 5\' 3"  (1.6 m), weight 82.6 kg, SpO2 94 %.  PHYSICAL EXAMINATION:  Physical Exam  GENERAL:  70 y.o.-year-old Caucasian female patient lying in the bed with no acute distress.  EYES: Pupils equal, round, reactive to light and accommodation. No scleral icterus. Extraocular muscles intact.  HEENT: Head atraumatic, normocephalic. Oropharynx and nasopharynx clear.  NECK:  Supple, no jugular venous distention. No  thyroid enlargement, no tenderness.  LUNGS: Normal breath sounds bilaterally, no wheezing, rales,rhonchi or crepitation. No use of accessory muscles of respiration.  CARDIOVASCULAR: Regular rate and rhythm, S1, S2 normal. No murmurs, rubs, or gallops.  ABDOMEN: Soft, nondistended, nontender. Bowel sounds present. No organomegaly or mass.  EXTREMITIES: No pedal edema, cyanosis, or clubbing.  NEUROLOGIC: Cranial nerves II through XII are intact. Muscle strength 5/5 in all extremities. Sensation intact. Gait not checked.  PSYCHIATRIC: The patient is alert and oriented x 3.  Normal affect and good eye contact. SKIN: No obvious rash, lesion, or ulcer.   LABORATORY PANEL:  CBC Recent Labs  Lab 08/14/20 1519  WBC 7.9  HGB 9.4*  HCT 28.5*  PLT 501*   ------------------------------------------------------------------------------------------------------------------  Chemistries  Recent Labs  Lab 08/14/20 1639  NA 120*  K 4.7  CL 85*  CO2 25  GLUCOSE 108*  BUN 10  CREATININE 0.72  CALCIUM 8.6*  AST 32  ALT 23  ALKPHOS 83  BILITOT 1.3*   ------------------------------------------------------------------------------------------------------------------  Cardiac Enzymes No results for input(s): TROPONINI in the last 168 hours. ------------------------------------------------------------------------------------------------------------------  RADIOLOGY:  DG Chest 2 View  Result Date: 08/14/2020 CLINICAL DATA:  Acute onset shortness of breath. EXAM: CHEST - 2 VIEW COMPARISON:  05/22/2018 and earlier. FINDINGS: Cardiac silhouette mildly enlarged, unchanged. Thoracic aorta severely atherosclerotic, unchanged. RIGHT innominate artery stent. Hilar and mediastinal contours otherwise unremarkable. Mildly prominent bronchovascular markings diffusely and mild central peribronchial thickening, unchanged. Lungs otherwise clear. No localized airspace consolidation. No pleural effusions. No  pneumothorax. Normal pulmonary vascularity. Visualized bony thorax unremarkable. IMPRESSION: Stable mild cardiomegaly. Stable mild changes of chronic bronchitis and/or asthma. No acute cardiopulmonary disease. Electronically Signed   By: Evangeline Dakin M.D.   On: 08/14/2020 15:46   CT Angio Chest PE W and/or Wo Contrast  Result Date: 08/14/2020 CLINICAL DATA:  PE suspected, recent surgery with altered anticoagulation EXAM: CT ANGIOGRAPHY CHEST WITH CONTRAST TECHNIQUE: Multidetector CT imaging of the chest was performed using the standard protocol during bolus administration of intravenous contrast. Multiplanar CT image reconstructions and MIPs were obtained to evaluate the vascular anatomy. CONTRAST:  67mL OMNIPAQUE IOHEXOL 350 MG/ML SOLN COMPARISON:  08/06/2020 FINDINGS: Cardiovascular: Satisfactory opacification of the pulmonary arteries to the segmental level. No evidence of pulmonary embolism. Normal heart size. Three-vessel coronary artery calcifications and/or stents. Trace pericardial effusion. Aortic atherosclerosis. Innominate artery stent. Mediastinum/Nodes: No enlarged mediastinal, hilar, or axillary lymph nodes. Thyroid gland, trachea, and esophagus demonstrate no significant findings. Lungs/Pleura: Moderate centrilobular emphysema. Diffuse bilateral bronchial wall thickening. No pleural effusion or pneumothorax. Upper Abdomen: No acute abnormality. Musculoskeletal: No chest wall abnormality. No acute or significant osseous findings. Review of the MIP images confirms the above findings. IMPRESSION: 1. Negative examination for pulmonary embolism. 2. Moderate centrilobular emphysema with diffuse bilateral bronchial wall thickening, consistent with nonspecific infectious or inflammatory bronchitis. 3. Coronary artery disease. Aortic Atherosclerosis (ICD10-I70.0) and Emphysema (ICD10-J43.9). Electronically Signed   By: Eddie Candle M.D.   On: 08/14/2020 19:00      IMPRESSION AND PLAN:  Active  Problems:   Hyponatremia  1.  Hyponatremia. -The patient will be admitted to a medically monitored bed. -We will obtain hyponatremia work-up. -We will hydrate with IV normal saline. -We will follow sodium levels.  2.  Urinary tract infection. -The patient will be continued on IV Levaquin given severe reaction to penicillin in the past. -We will follow urine culture and sensitivity.  3.  Acute bronchitis with chronic respiratory failure on home O2 at 3 L/min. -This should be covered with Levaquin and will add duo nebs 4 times daily and every 4 hours as needed. -She is currently having normal O2 sats on room air.  She likely needs oxygen at night and on a as needed basis.  4.  Essential hypertension. -We will continue Norvasc and carvedilol.  5.  Depression. -We will continue Lexapro and Cymbalta.  6.  GERD. -We will continue PPI therapy.  7.  Coronary artery disease. -Continue statin therapy, Imdur and Coreg as well as Plavix and aspirin.  DVT prophylaxis: Lovenox. Code Status: full code. Family Communication:  The  plan of care was discussed in details with the patient (with no available family history admit at bedside). I answered all questions. The patient agreed to proceed with the above mentioned plan. Further management will depend upon hospital course. Disposition Plan: Back to previous home environment Consults called: none. All the records are reviewed and case discussed with ED provider.  Status is: Inpatient  Remains inpatient appropriate because:Ongoing diagnostic testing needed not appropriate for outpatient work up, Unsafe d/c plan, IV treatments appropriate due to intensity of illness or inability to take PO and Inpatient level of care appropriate due to severity of illness   Dispo: The patient is from: SNF              Anticipated d/c is to: SNF              Patient currently is not medically stable to d/c.   Difficult to place patient No   TOTAL TIME  TAKING CARE OF THIS PATIENT: 55 minutes.    Christel Mormon M.D on 08/14/2020 at 8:04 PM  [facility]   From 7 PM-7 AM, contact night-coverage [email or web address]  CC: Primary care physician; [place], [place] [facility]

## 2020-08-14 NOTE — ED Triage Notes (Signed)
Pt BIBA from PCP for abnormal labs and low oxygen. Pt O2 was 81% on room air. Ems applied 4L [place], O2 now 99%. Pt unsure what las were abnormal. Hx of COPD.

## 2020-08-14 NOTE — ED Notes (Signed)
Patient is resting comfortably. Call light within reach. Fall precautions in place. Pt given pillow.

## 2020-08-14 NOTE — ED Notes (Signed)
Patient transported to CT 

## 2020-08-14 NOTE — ED Provider Notes (Signed)
[facility] Emergency Department Provider Note  ____________________________________________  Time seen: Approximately 3:07 PM  I have reviewed the triage vital signs and the nursing notes.   HISTORY  Chief Complaint Abnormal Lab and Shortness of Breath    HPI Kimberly Woodward is a 70 y.o. female who presents the emergency department by EMS from rehab for complaint of abnormal labs.  According to the patient she had recent labs performed at the time of her knee replacement.  She has been in rehab after her total knee replacement and the redo labs with a sodium of 124.  Patient was sent for hyponatremia, but on EMS arrival they noticed that the patient was hypoxic.  She denied any increased respiratory effort, wheezing.  She does have a history of COPD, coronary artery disease, GERD, hypertension.  Patient states that overall she feels fine without any significant complaints.  Again sent here for hyponatremia with a sodium of 124 and was found to be hypoxic incidentally.  Patient is currently on 4 L via nasal cannula.  She has no at home oxygen demand.         Past Medical History:  Diagnosis Date  . Colon polyps   . COPD (chronic obstructive pulmonary disease) ([place])   . Coronary artery disease   . GERD (gastroesophageal reflux disease)   . History of kidney stones   . Hyperlipidemia   . Hypertension   . LBBB (left bundle branch block)   . Pre-diabetes   . 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 Status post total knee replacement using cement, right 08/05/2020  . Status post total knee replacement using cement, left 08/03/2020  . Adrenal insufficiency (Addison's disease) ([place]) 08/08/2018  . Hyponatremia 08/02/2018  . Hyperkalemia 08/02/2018  . Acute kidney failure ([place]) 08/02/2018  . Obesity (BMI 35.0-39.9 without comorbidity) 01/15/2018  . Diabetes mellitus type 2,  uncomplicated ([place]) 26/94/8546  . Bilateral carotid bruits 08/23/2017  . 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 Nausea & vomiting 08/17/2017  . CAD (coronary artery disease) 06/13/2016  . Essential hypertension 10/15/2013  . COPD (chronic obstructive pulmonary disease) ([place]) 10/15/2013  . Chest pain, unspecified 10/15/2013  . Family history of malignant neoplasm of gastrointestinal tract 07/23/2013  . Diarrhea 07/23/2013  . Benign neoplasm of colon 07/23/2013    Past Surgical History:  Procedure Laterality Date  . ABDOMINAL HYSTERECTOMY    . BREAST BIOPSY Right    neg  . CARDIAC CATHETERIZATION Left 03/01/2016   Procedure: Left Heart Cath and Coronary Angiography;  Surgeon: Yolonda Kida, MD;  Location: [facility];  Service: Cardiovascular;  Laterality: Left;  . CHOLECYSTECTOMY    . HERNIA REPAIR    . KNEE ARTHROSCOPY WITH MEDIAL MENISECTOMY Left 07/15/2019   Procedure: KNEE ARTHROSCOPY WITH DEBRIDEMENT AND REPAIR VERSUS PATIAL MEDIAL AND LATERAL MENISECTOMY;  Surgeon: Corky Mull, MD;  Location: [facility];  Service: Orthopedics;  Laterality: Left;  . TONSILLECTOMY    . TOTAL KNEE ARTHROPLASTY Left 08/03/2020   Procedure: TOTAL KNEE ARTHROPLASTY;  Surgeon: Corky Mull, MD;  Location: [facility];  Service: Orthopedics;  Laterality: Left;    Prior to Admission medications   Medication Sig Start Date End Date Taking? Authorizing Provider  albuterol (PROVENTIL) (2.5 MG/3ML) 0.083% nebulizer solution Take 2.5 mg by nebulization every 6 (six) hours as needed for wheezing or shortness of breath.  [provider]  albuterol (VENTOLIN HFA) 108 (90 Base) MCG/ACT inhaler Inhale 1-2 puffs into the lungs every 6 (six) hours as needed (wheezing/shortness of breath).     [provider]  ALPRAZolam Duanne Moron) 0.25 MG tablet Take 0.25 mg by mouth at bedtime.  09/26/17   [provider]  amLODipine (NORVASC) 10 MG tablet Take 10 mg by mouth  daily in the afternoon.  01/02/19   [provider]  aspirin 81 MG chewable tablet Chew 81 mg by mouth daily.     [provider]  atorvastatin (LIPITOR) 10 MG tablet Take 10 mg by mouth daily. 01/02/19   [provider]  Budeson-Glycopyrrol-Formoterol (BREZTRI AEROSPHERE) 160-9-4.8 MCG/ACT AERO Inhale into the lungs.    [provider]  carvedilol (COREG) 6.25 MG tablet Take 6.25 mg by mouth in the morning and at bedtime.  05/29/19   [provider]  cholecalciferol (VITAMIN D) 25 MCG (1000 UNIT) tablet Take 1,000 Units by mouth daily.    [provider]  clopidogrel (PLAVIX) 75 MG tablet Take 75 mg by mouth daily. 01/08/19   [provider]  diphenhydrAMINE (BENADRYL) 50 MG tablet Take 50 mg by mouth at bedtime.  02/29/16   [provider]  DULoxetine (CYMBALTA) 30 MG capsule Take 30 mg by mouth daily. 03/01/19   [provider]  enoxaparin (LOVENOX) 40 MG/0.4ML injection Inject 0.4 mLs (40 mg total) into the skin daily. 08/04/20   Lattie Corns, PA-C  escitalopram (LEXAPRO) 10 MG tablet Take 10 mg by mouth daily. 01/27/19   [provider]  esomeprazole (NEXIUM) 40 MG capsule Take 40 mg by mouth daily.  03/18/18   [provider]  furosemide (LASIX) 20 MG tablet Take 20 mg by mouth every evening.  01/01/19 01/01/20  [provider]  gabapentin (NEURONTIN) 300 MG capsule Take 300 mg by mouth at bedtime.  09/16/18   [provider]  HYDROcodone-acetaminophen (NORCO/VICODIN) 5-325 MG tablet Take 1-2 tablets by mouth every 4 (four) hours as needed for moderate pain. 08/04/20   Lattie Corns, PA-C  hydrocortisone (CORTEF) 10 MG tablet Take 10 mg by mouth in the morning and at bedtime.  Patient not taking: No sig reported 03/01/19   [provider]  ipratropium (ATROVENT) 0.02 % nebulizer solution Take 0.5 mg by nebulization every 4 (four) hours as needed for wheezing or  shortness of breath.    [provider]  isosorbide mononitrate (IMDUR) 30 MG 24 hr tablet Take 30 mg by mouth every morning. Patient not taking: No sig reported 12/19/18   [provider]  Multiple Vitamin (MULTIVITAMIN WITH MINERALS) TABS tablet Take 1 tablet by mouth daily.    [provider]  nitroGLYCERIN (NITROSTAT) 0.4 MG SL tablet Place 0.4 mg under the tongue every 5 (five) minutes x 3 doses as needed for chest pain.  05/15/18 07/26/20  [provider]  potassium chloride (KLOR-CON) 10 MEQ tablet Take 20 mEq by mouth daily.  11/01/18 07/26/20  [provider]  [place] 2.5-2.5 MCG/ACT AERS  07/01/19   [provider]  tamsulosin (FLOMAX) 0.4 MG CAPS capsule Take 1 capsule (0.4 mg total) by mouth daily. 03/10/19   Stoioff, Ronda Fairly, MD  traMADol (ULTRAM) 50 MG tablet Take 1-2 tablets (50-100 mg total) by mouth every 6 (six) hours as needed for moderate pain. 08/04/20   Lattie Corns, PA-C  triamterene-hydrochlorothiazide (MAXZIDE-25) 37.5-25 MG tablet Take 1 tablet by mouth daily. 01/17/19  [provider]  umeclidinium-vilanterol (ANORO ELLIPTA) 62.5-25 MCG/INH AEPB Inhale 1 puff into the lungs in the morning, at noon, and at bedtime.  Patient not taking: No sig reported 03/26/18   [provider]  vitamin B-12 (CYANOCOBALAMIN) 1000 MCG tablet Take 1,000 mcg by mouth daily.    [provider]    Allergies Ivp dye [iodinated diagnostic agents] and Penicillins  Family History  Problem Relation Age of Onset  . Breast cancer Maternal Aunt   . Breast cancer Paternal Aunt     Social History Social History   Tobacco Use  . Smoking status: Former Smoker    Packs/day: 0.50    Years: 50.00    Pack years: 25.00    Types: Cigarettes    Quit date: 03/02/2015    Years since quitting: 5.4  . Smokeless tobacco: Never Used  Vaping Use  . Vaping Use: Never used  Substance Use Topics  . Alcohol use: No   . Drug use: No     Review of Systems  Constitutional: No fever/chills.  Sent here for hyponatremia with a sodium of 124 Eyes: No visual changes. No discharge ENT: No upper respiratory complaints. Cardiovascular: no chest pain. Respiratory: no cough. No SOB. Gastrointestinal: No abdominal pain.  No nausea, no vomiting.  No diarrhea.  No constipation. Musculoskeletal: Negative for musculoskeletal pain. Skin: Negative for rash, abrasions, lacerations, ecchymosis. Neurological: Negative for headaches, focal weakness or numbness.  10 System ROS otherwise negative.  ____________________________________________   PHYSICAL EXAM:  VITAL SIGNS: ED Triage Vitals  Enc Vitals Group     BP 08/14/20 1438 104/62     Pulse Rate 08/14/20 1438 85     Resp 08/14/20 1438 19     Temp 08/14/20 1435 98 F (36.7 C)     Temp Source 08/14/20 1435 Oral     SpO2 08/14/20 1438 100 %     Weight 08/14/20 1432 182 lb (82.6 kg)     Height 08/14/20 1432 5\' 3"  (1.6 m)     Head Circumference --      Peak Flow --      Pain Score 08/14/20 1435 0     Pain Loc --      Pain Edu? --      Excl. in [place]? --      Constitutional: Alert and oriented. Well appearing and in no acute distress. Eyes: Conjunctivae are normal. PERRL. EOMI. Head: Atraumatic. ENT:      Ears:       Nose: No congestion/rhinnorhea.      Mouth/Throat: Mucous membranes are moist.  Neck: No stridor.   Hematological/Lymphatic/Immunilogical: No cervical lymphadenopathy. Cardiovascular: Normal rate, regular rhythm. Normal S1 and S2.  Good peripheral circulation. Respiratory: Normal respiratory effort without tachypnea or retractions. Lungs CTAB. Good air entry to the bases with no decreased or absent breath sounds. Musculoskeletal: Full range of motion to all extremities. No gross deformities appreciated.  Left lower extremity with edema and staples still in place from recent total knee.  No surrounding erythema or concerning amounts of edema  for postsurgical changes. Neurologic:  Normal speech and language. No gross focal neurologic deficits are appreciated.  Skin:  Skin is warm, dry and intact. No rash noted. Psychiatric: Mood and affect are normal. Speech and behavior are normal. Patient exhibits appropriate insight and judgement.   ____________________________________________   LABS (all labs ordered are listed, but only abnormal results are displayed)  Labs Reviewed  CBC WITH DIFFERENTIAL/PLATELET - Abnormal; Notable for  the following components:      Result Value   RBC 3.41 (*)    Hemoglobin 9.4 (*)    HCT 28.5 (*)    Platelets 501 (*)    Abs Immature Granulocytes 0.14 (*)    All other components within normal limits  URINALYSIS, COMPLETE (UACMP) WITH MICROSCOPIC - Abnormal; Notable for the following components:   Color, Urine YELLOW (*)    APPearance HAZY (*)    Hgb urine dipstick MODERATE (*)    Nitrite POSITIVE (*)    Leukocytes,Ua TRACE (*)    Bacteria, UA FEW (*)    All other components within normal limits  COMPREHENSIVE METABOLIC PANEL - Abnormal; Notable for the following components:   Sodium 120 (*)    Chloride 85 (*)    Glucose, Bld 108 (*)    Calcium 8.6 (*)    Albumin 3.2 (*)    Total Bilirubin 1.3 (*)    All other components within normal limits  D-DIMER, QUANTITATIVE (NOT AT [facility]) - Abnormal; Notable for the following components:   D-Dimer, Quant 3.27 (*)    All other components within normal limits  URINE CULTURE  NA AND K (SODIUM & POTASSIUM), RAND UR  OSMOLALITY, URINE  OSMOLALITY  TSH  HIV ANTIBODY (ROUTINE TESTING W REFLEX)  BASIC METABOLIC PANEL  CBC  TROPONIN I (HIGH SENSITIVITY)  TROPONIN I (HIGH SENSITIVITY)   ____________________________________________  EKG   ____________________________________________  RADIOLOGY I personally viewed and evaluated these images as part of my medical decision making, as well as reviewing the written report by the radiologist.  ED  Provider Interpretation: CT scan reveals no evidence of PE.  Diffuse changes peribronchial he and bilaterally consistent with COPD exacerbation versus bronchitis  DG Chest 2 View  Result Date: 08/14/2020 CLINICAL DATA:  Acute onset shortness of breath. EXAM: CHEST - 2 VIEW COMPARISON:  05/22/2018 and earlier. FINDINGS: Cardiac silhouette mildly enlarged, unchanged. Thoracic aorta severely atherosclerotic, unchanged. RIGHT innominate artery stent. Hilar and mediastinal contours otherwise unremarkable. Mildly prominent bronchovascular markings diffusely and mild central peribronchial thickening, unchanged. Lungs otherwise clear. No localized airspace consolidation. No pleural effusions. No pneumothorax. Normal pulmonary vascularity. Visualized bony thorax unremarkable. IMPRESSION: Stable mild cardiomegaly. Stable mild changes of chronic bronchitis and/or asthma. No acute cardiopulmonary disease. Electronically Signed   By: Evangeline Dakin M.D.   On: 08/14/2020 15:46   CT Angio Chest PE W and/or Wo Contrast  Result Date: 08/14/2020 CLINICAL DATA:  PE suspected, recent surgery with altered anticoagulation EXAM: CT ANGIOGRAPHY CHEST WITH CONTRAST TECHNIQUE: Multidetector CT imaging of the chest was performed using the standard protocol during bolus administration of intravenous contrast. Multiplanar CT image reconstructions and MIPs were obtained to evaluate the vascular anatomy. CONTRAST:  6mL OMNIPAQUE IOHEXOL 350 MG/ML SOLN COMPARISON:  08/06/2020 FINDINGS: Cardiovascular: Satisfactory opacification of the pulmonary arteries to the segmental level. No evidence of pulmonary embolism. Normal heart size. Three-vessel coronary artery calcifications and/or stents. Trace pericardial effusion. Aortic atherosclerosis. Innominate artery stent. Mediastinum/Nodes: No enlarged mediastinal, hilar, or axillary lymph nodes. Thyroid gland, trachea, and esophagus demonstrate no significant findings. Lungs/Pleura: Moderate  centrilobular emphysema. Diffuse bilateral bronchial wall thickening. No pleural effusion or pneumothorax. Upper Abdomen: No acute abnormality. Musculoskeletal: No chest wall abnormality. No acute or significant osseous findings. Review of the MIP images confirms the above findings. IMPRESSION: 1. Negative examination for pulmonary embolism. 2. Moderate centrilobular emphysema with diffuse bilateral bronchial wall thickening, consistent with nonspecific infectious or inflammatory bronchitis. 3. Coronary artery disease. Aortic Atherosclerosis (  ICD10-I70.0) and Emphysema (ICD10-J43.9). Electronically Signed   By: Eddie Candle M.D.   On: 08/14/2020 19:00    ____________________________________________    PROCEDURES  Procedure(s) performed:    Procedures    Medications  levofloxacin (LEVAQUIN) IVPB 750 mg (has no administration in time range)  aspirin chewable tablet 81 mg (has no administration in time range)  traMADol (ULTRAM) tablet 50-100 mg (has no administration in time range)  amLODipine (NORVASC) tablet 10 mg (has no administration in time range)  atorvastatin (LIPITOR) tablet 10 mg (has no administration in time range)  carvedilol (COREG) tablet 6.25 mg (has no administration in time range)  isosorbide mononitrate (IMDUR) 24 hr tablet 30 mg (has no administration in time range)  nitroGLYCERIN (NITROSTAT) SL tablet 0.4 mg (has no administration in time range)  ALPRAZolam (XANAX) tablet 0.25 mg (has no administration in time range)  DULoxetine (CYMBALTA) DR capsule 30 mg (has no administration in time range)  escitalopram (LEXAPRO) tablet 10 mg (has no administration in time range)  pantoprazole (PROTONIX) EC tablet 40 mg (has no administration in time range)  tamsulosin (FLOMAX) capsule 0.4 mg (has no administration in time range)  clopidogrel (PLAVIX) tablet 75 mg (has no administration in time range)  enoxaparin (LOVENOX) injection 40 mg (has no administration in time range)   vitamin B-12 (CYANOCOBALAMIN) tablet 1,000 mcg (has no administration in time range)  gabapentin (NEURONTIN) capsule 300 mg (has no administration in time range)  cholecalciferol (VITAMIN D) tablet 1,000 Units (has no administration in time range)  multivitamin with minerals tablet 1 tablet (has no administration in time range)  potassium chloride (KLOR-CON) CR tablet 20 mEq (has no administration in time range)  albuterol (PROVENTIL) (2.5 MG/3ML) 0.083% nebulizer solution 2.5 mg (has no administration in time range)  albuterol (VENTOLIN HFA) 108 (90 Base) MCG/ACT inhaler 1-2 puff (has no administration in time range)  diphenhydrAMINE (BENADRYL) tablet 50 mg (has no administration in time range)  ipratropium-albuterol (DUONEB) 0.5-2.5 (3) MG/3ML nebulizer solution 3 mL (has no administration in time range)  enoxaparin (LOVENOX) injection 40 mg (has no administration in time range)  cefTRIAXone (ROCEPHIN) 1 g in sodium chloride 0.9 % 100 mL IVPB (has no administration in time range)  0.9 %  sodium chloride infusion (has no administration in time range)  acetaminophen (TYLENOL) tablet 650 mg (has no administration in time range)    Or  acetaminophen (TYLENOL) suppository 650 mg (has no administration in time range)  traZODone (DESYREL) tablet 25 mg (has no administration in time range)  magnesium hydroxide (MILK OF MAGNESIA) suspension 30 mL (has no administration in time range)  ondansetron (ZOFRAN) tablet 4 mg (has no administration in time range)    Or  ondansetron (ZOFRAN) injection 4 mg (has no administration in time range)  sodium chloride 0.9 % bolus 500 mL (0 mLs Intravenous Stopped 08/14/20 1653)  hydrocortisone sodium succinate (SOLU-CORTEF) injection 200 mg (200 mg Intravenous Given 08/14/20 1605)  diphenhydrAMINE (BENADRYL) capsule 50 mg ( Oral See Alternative 08/14/20 1749)    Or  diphenhydrAMINE (BENADRYL) injection 50 mg (50 mg Intravenous Given 08/14/20 1749)  iohexol (OMNIPAQUE) 350  MG/ML injection 75 mL (75 mLs Intravenous Contrast Given 08/14/20 1848)  albuterol (PROVENTIL) (2.5 MG/3ML) 0.083% nebulizer solution 2.5 mg (2.5 mg Nebulization Given 08/14/20 1947)  sodium chloride 0.9 % bolus 1,000 mL (1,000 mLs Intravenous New Bag/Given 08/14/20 1949)     ____________________________________________   INITIAL IMPRESSION / ASSESSMENT AND PLAN / ED COURSE  Pertinent labs &  imaging results that were available during my care of the patient were reviewed by me and considered in my medical decision making (see chart for details).  Review of the [place] CSRS was performed in accordance of the [place] prior to dispensing any controlled drugs.           Patient's diagnosis is consistent with hyponatremia, COPD exacerbation, UTI.  Patient presented to emergency department primarily secondary to abnormal labs at the rehab facility.  Patient is recent total knee replacement left side.  No complications with the knee but routine labs revealed that she was hyponatremic at a level of 124.  Was largely asymptomatic on arrival.  She does state that she has had some burning and frequency with urination.  No flank pain.  Patient had some hypoxia noted but initially did not have any respiratory changes.  She states that she is progressively felt tight through her chest with no true pleuritic pain.  No wheezing.  No increased work of breathing.  Given the recent surgery with the hypoxia with initially no respiratory complaints CT scan of the chest was ordered.  No evidence of PE.  Patient does have diffuse bronchitic changes consistent with bronchitis versus COPD exacerbation.  Patient does have findings on urine consistent with UTI.  I will treat with albuterol for the bronchitis/COPD but hold steroids as she is not wheezing or having increased work of breathing.  Patient will have antibiotics for her urinary tract infection.  Hyponatremia had level of 120.  Patient has been given fluids.  Will admit to the  hospital service at this time for COPD exacerbation, hyponatremia and UTI..     ____________________________________________  FINAL CLINICAL IMPRESSION(S) / ED DIAGNOSES  Final diagnoses:  Hyponatremia  Acute cystitis without hematuria  COPD exacerbation (HCC)      NEW MEDICATIONS STARTED DURING THIS VISIT:  ED Discharge Orders    None          This chart was dictated using voice recognition software/Dragon. Despite best efforts to proofread, errors can occur which can change the meaning. Any change was purely unintentional.    Brynda Peon 08/14/20 2002    Nance Pear, MD 08/14/20 [phone]

## 2020-08-15 LAB — HIV ANTIBODY (ROUTINE TESTING W REFLEX): HIV Screen 4th Generation wRfx: NONREACTIVE

## 2020-08-15 LAB — CBC
HCT: 28.4 % — ABNORMAL LOW (ref 36.0–46.0)
Hemoglobin: 9.3 g/dL — ABNORMAL LOW (ref 12.0–15.0)
MCH: 27.2 pg (ref 26.0–34.0)
MCHC: 32.7 g/dL (ref 30.0–36.0)
MCV: 83 fL (ref 80.0–100.0)
Platelets: 463 10*3/uL — ABNORMAL HIGH (ref 150–400)
RBC: 3.42 MIL/uL — ABNORMAL LOW (ref 3.87–5.11)
RDW: 14.2 % (ref 11.5–15.5)
WBC: 7.4 10*3/uL (ref 4.0–10.5)
nRBC: 0 % (ref 0.0–0.2)

## 2020-08-15 LAB — BASIC METABOLIC PANEL
Anion gap: 7 (ref 5–15)
BUN: 8 mg/dL (ref 8–23)
CO2: 27 mmol/L (ref 22–32)
Calcium: 9.1 mg/dL (ref 8.9–10.3)
Chloride: 92 mmol/L — ABNORMAL LOW (ref 98–111)
Creatinine, Ser: 0.76 mg/dL (ref 0.44–1.00)
GFR, Estimated: >60 mL/min (ref >60)
Glucose, Bld: 110 mg/dL — ABNORMAL HIGH (ref 70–99)
Potassium: 4.5 mmol/L (ref 3.5–5.1)
Sodium: 126 mmol/L — ABNORMAL LOW (ref 135–145)

## 2020-08-15 LAB — OSMOLALITY: Osmolality: 258 mOsm/kg — ABNORMAL LOW (ref 275–295)

## 2020-08-15 LAB — SODIUM: Sodium: 124 mmol/L — ABNORMAL LOW (ref 135–145)

## 2020-08-15 MED ORDER — LEVOFLOXACIN IN D5W 500 MG/100ML IV SOLN
500.0000 mg | INTRAVENOUS | Status: DC
  Filled 2020-08-15: qty 100

## 2020-08-15 MED ORDER — LEVOFLOXACIN IN D5W 250 MG/50ML IV SOLN
250.0000 mg | INTRAVENOUS | Status: AC
  Administered 2020-08-15 – 2020-08-16 (×2): 250 mg via INTRAVENOUS
  Filled 2020-08-15 (×2): qty 50

## 2020-08-15 NOTE — Progress Notes (Signed)
Per MD Arbutus Ped please hold patient's late afternoon dose of oral Norvasc and Coreg 08/15/2020 for soft BP. Patient is asymptomatic, will continue to monitor through the remainder of this writers scheduled shift.

## 2020-08-15 NOTE — Hospital Course (Addendum)
Kimberly Woodward is a 70 y.o. Caucasian female with medical history significant for Addison's disease, COPD recently started on 3 L/min home oxygen, CAD s/p PCI in 2015, HTN, HLD, GERD and left bundle branch block who presented to there ED on 08/14/20 from her rehab facility where she has been since 08/10/2020 after having left total knee arthroplasty.  She was found to have hyponatremia on labs, sodium of 124, and referred to hospital for evaluation.  She reported generalized fatigue, tiredness, mild dyspnea without cough or wheezing, some chills, a migraine headache, nausea without vomiting, and dysuria for past 3 days with urgency.   Initial evaluation notable for Na 120, Cl 85, T bili 1.3, stable anemia Hbg 9.4.  D-dimer elevated 3.27.  CTA chest negative for PE, showed emphysema and diffuse bronchial wall thickening c/w infectious or inflammatory bronchitis.  UA was suspicious for infection with positive nitrite, sent for culture.   Treated with albuterol neb, IV Levaquin, 1.5 L NS.   She was premedicated for CTA with IV Benadryl and Solu-Cortef due to documented allergy, tolerated the scan without reactions.  Admitted to hospitalist service for further management.

## 2020-08-15 NOTE — Progress Notes (Addendum)
PROGRESS NOTE    Kimberly Woodward   TTS:[id number]  DOB: Aug 09, 1950  PCP: [facility]    DOA: 08/14/2020 LOS: 1   Brief Narrative   Kimberly Woodward is a 70 y.o. Caucasian female with medical history significant for COPD, hypertension, dyslipidemia, GERD and left bundle branch block who presented to there ED on 08/14/20 from her rehab facility where she has been since 08/10/2020 after having left total knee arthroplasty.  She was found to have hyponatremia on labs, sodium of 124, and referred to hospital for evaluation.  She reported generalized fatigue, tiredness, mild dyspnea without cough or wheezing, some chills, a migraine headache, nausea without vomiting, and dysuria for past 3 days with urgency.   Initial evaluation notable for Na 120, Cl 85, T bili 1.3, stable anemia Hbg 9.4.  D-dimer elevated 3.27.  CTA chest negative for PE, showed emphysema and diffuse bronchial wall thickening c/w infectious or inflammatory bronchitis.  UA was suspicious for infection with positive nitrite, sent for culture.   Treated with albuterol neb, IV Levaquin, 1.5 L NS.   She was premedicated for CTA with IV Benadryl and Solu-Cortef due to documented allergy, tolerated the scan without reactions.  Admitted to hospitalist service for further management.    Assessment & Plan   Active Problems:   Hyponatremia    Euvolemic Hypotonic Hyponatremia - POA with Na 120.  Pt has hx of Addison's disease / adrenal insufficiency based on chart review.  Appears currently not on hydrocortisone outpatient, based on brief chart review in [place]. Serum osm 258 / Ur osm 311 / Ur Na 74. Med hx pending but appears she takes PO Lasix 20 mg. Na improved 120 >> 126 with IV NS started on admission Suspect adrenal insufficiency given hx, pt craving and asking for salt this AM. D/dx also includes hypothyroidism, stress, SIADH, drug effect PLAN: --repeat Na level this afternoon --AM cortisol, TSH,  uric acid  --hold Lasix,. Maxzide (verify med hx) --continue IV fluids for now --follow up cortisol before starting Solu-Cortef, unless signs of adrenal crisis (namely hypotension, hypoglycemia)   Urinary tract infection - POA. Did not meet sepsis criteria on admission.  Pt with dysuria and urgency. --Continue Levaquin pending culture -The patient will be continued on IV Levaquin given severe reaction to penicillin in the past. --Follow urine culture  Recent Right Total Knee Replacement - admitted from SNF, but was about to go home with [facility]. --PT and OT evaluations --Anticipate Home with HH --Continue Polar Cube, Ortho care per  --VTE prophylaxis per ortho: continue Lovenox for two weeks prior to returning to Plavix (per d/c summary 3/30) --Scheduled for follow up and staple removal on 4/13  Hx of Addison's disease - noted in chart review.  Med history not completed yet, but last med history reflects she was no longer taking hydrocortisone, most recent PCP note also does not list this on med list. --AM cortisol level --Low threshold to initiate Solu-Cortef if worsening hypotension, otherwise wait until we get AM level tomorrow --If suspected adrenal crisis developing, would give Solu-Coref 100 mg IV x 1 followed by 50 mg IV q6h x 24 hours, IV fluid support and monitor/treat hypoglycemia --Monitor BP, BMP  Acute bronchitis with chronic respiratory failure on home O2 at 3 L/min -  --On Levaquin as above.and will add duo nebs 4 times daily --Duonebs scheduled and PRN's --Titrate supplemental O2 for sats >= 90%  Essential hypertension - continue Norvasc and carvedilol. Low diastolic BP's,  Coreg was held  Depression - continue Lexapro, Cymbalta  GERD - continue PPI  Coronary artery disease - stable, s/p PCI in 2015. No chest pain.  Continue statin, Imdur, Coreg, ASA, Plavix  Obesity: Body mass index is 32.24 kg/m.  Complicates overall care and prognosis.  Recommend lifestyle  modifications including physical activity and diet for weight loss and overall long-term health.   DVT prophylaxis:  Lovenox   Diet:  Diet Orders (From admission, onward)    Start     Ordered   08/15/20 1245  Diet regular Room service appropriate? Yes; Fluid consistency: Thin  Diet effective now       Comments: Please send patient a Dr. Malachi Bonds with meals. Thank you  Question Answer Comment  Room service appropriate? Yes   Fluid consistency: Thin      08/15/20 1245            Code Status: Full Code    Subjective 08/15/20    Pt seen this AM, awake sitting up in bed.  Reports feeling okay.  Asking for regular diet so she can have some salt.  Talks about terrible food at rehab and not getting medications correctly.  Also was not given oxygen as she was supposed to be.  Was nearing discharge home prior to this admission.    Disposition Plan & Communication   Status is: Inpatient  Inpatient status is appropriate due to ongoing electrolyte abnormalities requiring close monitoring, IV therapies, ongoing evaluation.    Dispo: The patient is from: SNF              Anticipated d/c is to: home with [facility]              Patient currently Not medically stable for d/c.    Difficult to place patient No    Consults, Procedures, Significant Events   Consultants:   None  Procedures:   None  Antimicrobials:  Anti-infectives (From admission, onward)   Start     Dose/Rate Route Frequency Ordered Stop   08/15/20 2000  Levofloxacin (LEVAQUIN) IVPB 250 mg        250 mg 50 mL/hr over 60 Minutes Intravenous Every 24 hours 08/15/20 1140     08/15/20 1600  levofloxacin (LEVAQUIN) IVPB 500 mg  Status:  Discontinued        500 mg 100 mL/hr over 60 Minutes Intravenous Every 24 hours 08/15/20 0238 08/15/20 1140   08/14/20 2015  cefTRIAXone (ROCEPHIN) 1 g in sodium chloride 0.9 % 100 mL IVPB  Status:  Discontinued        1 g 200 mL/hr over 30 Minutes Intravenous Every 24 hours 08/14/20 2001  08/14/20 2200   08/14/20 1930  levofloxacin (LEVAQUIN) IVPB 750 mg        750 mg 100 mL/hr over 90 Minutes Intravenous  Once 08/14/20 1927 08/14/20 2020        Micro    Objective   Vitals:   08/15/20 0529 08/15/20 0843 08/15/20 1307 08/15/20 1626  BP: 101/81 (!) 131/59 (!) 114/43 (!) 108/33  Pulse: 78 73 72 72  Resp: 16 17 19 15   Temp: (!) 97.5 F (36.4 C) 98.5 F (36.9 C) 98.7 F (37.1 C) 97.6 F (36.4 C)  TempSrc: Oral Oral Oral Oral  SpO2: 98% 100% 100% 99%  Weight:      Height:        Intake/Output Summary (Last 24 hours) at 08/15/2020 1712 Last data filed at 08/15/2020 1400 Gross per 24  hour  Intake 240 ml  Output 700 ml  Net -460 ml   Filed Weights   08/14/20 1432  Weight: 82.6 kg    Physical Exam:  General exam: awake, alert, no acute distress HEENT: atraumatic, clear conjunctiva, anicteric sclera, moist mucus membranes, hearing grossly normal  Respiratory system: CTAB, no wheezes, rales or rhonchi, normal respiratory effort. Cardiovascular system: normal S1/S2, RRR, no JVD, murmurs, rubs, gallops Gastrointestinal system: soft, NT, ND Central nervous system: A&O x4. no gross focal neurologic deficits, normal speech Extremities: R leg with polar ice in place, no b/l pedal edema, normal tone Skin: dry, intact, normal temperature Psychiatry: normal mood, congruent affect, judgement and insight appear normal  Labs   Data Reviewed: I have personally reviewed following labs and imaging studies  CBC: Recent Labs  Lab 08/14/20 1519 08/15/20 0555  WBC 7.9 7.4  NEUTROABS 5.7  --   HGB 9.4* 9.3*  HCT 28.5* 28.4*  MCV 83.6 83.0  PLT 501* 888*   Basic Metabolic Panel: Recent Labs  Lab 08/09/20 0613 08/14/20 1639 08/15/20 0555 08/15/20 1358  NA 132* 120* 126* 124*  K 3.8 4.7 4.5  --   CL 93* 85* 92*  --   CO2 31 25 27   --   GLUCOSE 105* 108* 110*  --   BUN 10 10 8   --   CREATININE 0.63 0.72 0.76  --   CALCIUM 8.5* 8.6* 9.1  --     GFR: Estimated Creatinine Clearance: 67.6 mL/min (by C-G formula based on SCr of 0.76 mg/dL). Liver Function Tests: Recent Labs  Lab 08/14/20 1639  AST 32  ALT 23  ALKPHOS 83  BILITOT 1.3*  PROT 6.9  ALBUMIN 3.2*   No results for input(s): LIPASE, AMYLASE in the last 168 hours. No results for input(s): AMMONIA in the last 168 hours. Coagulation Profile: No results for input(s): INR, PROTIME in the last 168 hours. Cardiac Enzymes: No results for input(s): CKTOTAL, CKMB, CKMBINDEX, TROPONINI in the last 168 hours. BNP (last 3 results) No results for input(s): PROBNP in the last 8760 hours. HbA1C: No results for input(s): HGBA1C in the last 72 hours. CBG: No results for input(s): GLUCAP in the last 168 hours. Lipid Profile: No results for input(s): CHOL, HDL, LDLCALC, TRIG, CHOLHDL, LDLDIRECT in the last 72 hours. Thyroid Function Tests: Recent Labs    08/14/20 1644  TSH 1.663   Anemia Panel: No results for input(s): VITAMINB12, FOLATE, FERRITIN, TIBC, IRON, RETICCTPCT in the last 72 hours. Sepsis Labs: No results for input(s): PROCALCITON, LATICACIDVEN in the last 168 hours.  Recent Results (from the past 240 hour(s))  SARS CORONAVIRUS 2 (TAT 6-24 HRS) Nasopharyngeal Nasopharyngeal Swab     Status: None   Collection Time: 08/08/20  5:27 PM   Specimen: Nasopharyngeal Swab  Result Value Ref Range Status   SARS Coronavirus 2 NEGATIVE NEGATIVE Final    Comment: (NOTE) SARS-CoV-2 target nucleic acids are NOT DETECTED.  The SARS-CoV-2 RNA is generally detectable in upper and lower respiratory specimens during the acute phase of infection. Negative results do not preclude SARS-CoV-2 infection, do not rule out co-infections with other pathogens, and should not be used as the sole basis for treatment or other patient management decisions. Negative results must be combined with clinical observations, patient history, and epidemiological information. The expected result  is Negative.  Fact Sheet for Patients: SugarRoll.be  Fact Sheet for Healthcare Providers: https://www.woods-mathews.com/  This test is not yet approved or cleared by the [place]  FDA and  has been authorized for detection and/or diagnosis of SARS-CoV-2 by FDA under an Emergency Use Authorization (EUA). This EUA will remain  in effect (meaning this test can be used) for the duration of the COVID-19 declaration under Se ction 564(b)(1) of the Act, 21 U.S.C. section 360bbb-3(b)(1), unless the authorization is terminated or revoked sooner.  Performed at [facility], [address] [place], [place] [address]       Imaging Studies   DG Chest 2 View  Result Date: 08/14/2020 CLINICAL DATA:  Acute onset shortness of breath. EXAM: CHEST - 2 VIEW COMPARISON:  05/22/2018 and earlier. FINDINGS: Cardiac silhouette mildly enlarged, unchanged. Thoracic aorta severely atherosclerotic, unchanged. RIGHT innominate artery stent. Hilar and mediastinal contours otherwise unremarkable. Mildly prominent bronchovascular markings diffusely and mild central peribronchial thickening, unchanged. Lungs otherwise clear. No localized airspace consolidation. No pleural effusions. No pneumothorax. Normal pulmonary vascularity. Visualized bony thorax unremarkable. IMPRESSION: Stable mild cardiomegaly. Stable mild changes of chronic bronchitis and/or asthma. No acute cardiopulmonary disease. Electronically Signed   By: Evangeline Dakin M.D.   On: 08/14/2020 15:46   CT Angio Chest PE W and/or Wo Contrast  Result Date: 08/14/2020 CLINICAL DATA:  PE suspected, recent surgery with altered anticoagulation EXAM: CT ANGIOGRAPHY CHEST WITH CONTRAST TECHNIQUE: Multidetector CT imaging of the chest was performed using the standard protocol during bolus administration of intravenous contrast. Multiplanar CT image reconstructions and MIPs were obtained to evaluate the vascular  anatomy. CONTRAST:  65mL OMNIPAQUE IOHEXOL 350 MG/ML SOLN COMPARISON:  08/06/2020 FINDINGS: Cardiovascular: Satisfactory opacification of the pulmonary arteries to the segmental level. No evidence of pulmonary embolism. Normal heart size. Three-vessel coronary artery calcifications and/or stents. Trace pericardial effusion. Aortic atherosclerosis. Innominate artery stent. Mediastinum/Nodes: No enlarged mediastinal, hilar, or axillary lymph nodes. Thyroid gland, trachea, and esophagus demonstrate no significant findings. Lungs/Pleura: Moderate centrilobular emphysema. Diffuse bilateral bronchial wall thickening. No pleural effusion or pneumothorax. Upper Abdomen: No acute abnormality. Musculoskeletal: No chest wall abnormality. No acute or significant osseous findings. Review of the MIP images confirms the above findings. IMPRESSION: 1. Negative examination for pulmonary embolism. 2. Moderate centrilobular emphysema with diffuse bilateral bronchial wall thickening, consistent with nonspecific infectious or inflammatory bronchitis. 3. Coronary artery disease. Aortic Atherosclerosis (ICD10-I70.0) and Emphysema (ICD10-J43.9). Electronically Signed   By: Eddie Candle M.D.   On: 08/14/2020 19:00     Medications   Scheduled Meds: . ALPRAZolam  0.25 mg Oral QHS  . amLODipine  10 mg Oral Q1500  . aspirin  81 mg Oral Daily  . atorvastatin  10 mg Oral Daily  . carvedilol  6.25 mg Oral BID WC  . cholecalciferol  1,000 Units Oral Daily  . clopidogrel  75 mg Oral Daily  . DULoxetine  30 mg Oral Daily  . enoxaparin (LOVENOX) injection  0.5 mg/kg Subcutaneous Q24H  . escitalopram  10 mg Oral Daily  . gabapentin  300 mg Oral QHS  . ipratropium-albuterol  3 mL Nebulization QID  . multivitamin with minerals  1 tablet Oral Daily  . pantoprazole  40 mg Oral Daily  . tamsulosin  0.4 mg Oral Daily  . vitamin B-12  1,000 mcg Oral Daily   Continuous Infusions: . sodium chloride 100 mL/hr at 08/15/20 0926  .  levofloxacin (LEVAQUIN) IV         LOS: 1 day    Time spent: 30 minutes    Ezekiel Slocumb, DO [facility]  08/15/2020, 5:12 PM      If  7PM-7AM, please contact night-coverage. How to contact the [facility] Attending or Consulting provider [place] or covering provider during after hours [place], for this patient?    1. Check the care team in [facility] and look for a) attending/consulting TR[facility] provider listed and b) the [facility] team listed 2. Log into [email or web address] and use [place]'s universal password to access. If you do not have the password, please contact the hospital operator. 3. Locate the [facility] provider you are looking for under [facility] and page to a number that you can be directly reached. 4. If you still have difficulty reaching the provider, please page the [facility] (Director on Call) for the Hospitalists listed on amion for assistance.

## 2020-08-15 NOTE — Plan of Care (Signed)
  Problem: Health Behavior/Discharge Planning: Goal: Ability to manage health-related needs will improve Outcome: Progressing   Problem: Clinical Measurements: Goal: Will remain free from infection Outcome: Progressing   

## 2020-08-15 NOTE — Progress Notes (Signed)
PHARMACY NOTE:  ANTIMICROBIAL RENAL DOSAGE ADJUSTMENT  Current antimicrobial regimen includes a mismatch between antimicrobial dosage and estimated renal function.  As per policy approved by the Pharmacy & Therapeutics and Medical Executive Committees, the antimicrobial dosage will be adjusted accordingly.  Current antimicrobial dosage:  Levofloxacin 500 mg IV q24h  Indication: UTI  Renal Function:  Estimated Creatinine Clearance: 67.6 mL/min (by C-G formula based on SCr of 0.76 mg/dL).    Antimicrobial dosage has been changed to:  Levofloxacin 250 mg IV q24h  Thank you for allowing pharmacy to be a part of this patient's care.  Benita Gutter, [facility] 08/15/2020 11:41 AM

## 2020-08-16 LAB — CBC
HCT: 26.7 % — ABNORMAL LOW (ref 36.0–46.0)
Hemoglobin: 8.6 g/dL — ABNORMAL LOW (ref 12.0–15.0)
MCH: 27.5 pg (ref 26.0–34.0)
MCHC: 32.2 g/dL (ref 30.0–36.0)
MCV: 85.3 fL (ref 80.0–100.0)
Platelets: 437 10*3/uL — ABNORMAL HIGH (ref 150–400)
RBC: 3.13 MIL/uL — ABNORMAL LOW (ref 3.87–5.11)
RDW: 14.7 % (ref 11.5–15.5)
WBC: 5.9 10*3/uL (ref 4.0–10.5)
nRBC: 0 % (ref 0.0–0.2)

## 2020-08-16 LAB — BASIC METABOLIC PANEL
Anion gap: 7 (ref 5–15)
BUN: 8 mg/dL (ref 8–23)
CO2: 27 mmol/L (ref 22–32)
Calcium: 8.9 mg/dL (ref 8.9–10.3)
Chloride: 94 mmol/L — ABNORMAL LOW (ref 98–111)
Creatinine, Ser: 0.67 mg/dL (ref 0.44–1.00)
GFR, Estimated: >60 mL/min (ref >60)
Glucose, Bld: 96 mg/dL (ref 70–99)
Potassium: 4 mmol/L (ref 3.5–5.1)
Sodium: 128 mmol/L — ABNORMAL LOW (ref 135–145)

## 2020-08-16 LAB — TSH: TSH: 4.674 u[IU]/mL — ABNORMAL HIGH (ref 0.350–4.500)

## 2020-08-16 LAB — SODIUM: Sodium: 128 mmol/L — ABNORMAL LOW (ref 135–145)

## 2020-08-16 LAB — URIC ACID: Uric Acid, Serum: 4 mg/dL (ref 2.5–7.1)

## 2020-08-16 LAB — CORTISOL: Cortisol, Plasma: 2.8 ug/dL

## 2020-08-16 MED ORDER — BISACODYL 5 MG PO TBEC: 5.0000 mg | DELAYED_RELEASE_TABLET | Freq: Every day | ORAL | Status: DC | PRN

## 2020-08-16 MED ORDER — HYDROCORTISONE NA SUCCINATE PF 100 MG IJ SOLR
50.0000 mg | Freq: Once | INTRAMUSCULAR | Status: AC
  Administered 2020-08-16: 50 mg via INTRAVENOUS
  Filled 2020-08-16: qty 1

## 2020-08-16 MED ORDER — POLYETHYLENE GLYCOL 3350 17 G PO PACK
17.0000 g | PACK | Freq: Every day | ORAL | Status: DC
  Administered 2020-08-16 – 2020-08-17 (×2): 17 g via ORAL
  Filled 2020-08-16 (×2): qty 1

## 2020-08-16 MED ORDER — SENNOSIDES-DOCUSATE SODIUM 8.6-50 MG PO TABS
1.0000 | ORAL_TABLET | Freq: Two times a day (BID) | ORAL | Status: DC
  Administered 2020-08-16 – 2020-08-17 (×2): 1 via ORAL
  Filled 2020-08-16 (×2): qty 1

## 2020-08-16 NOTE — Evaluation (Signed)
Occupational Therapy Evaluation Patient Details Name: Kimberly Woodward MRN: [id number] DOB: Feb 01, 1951 Today's Date: 08/16/2020    History of Present Illness presented to ER secondary to SOB, abnormal lab values; admitted for management of acute hyponatremia, UTI.  s/p L TKR, WBAT (08/03/2020)   Clinical Impression   Kimberly Woodward was seen for OT evaluation this date. Prior to hospital admission, pt was recent resident at [facility] following L TKR. Pt lives alone in mother in law suite at daughters house, family available PRN. Pt presents to acute OT demonstrating impaired ADL performance and functional mobility 2/2 decreased activity tolerance and functional strength/ROM deficits. Pt currently requires SBA + RW toilet t/f and perihygiene in standing. SBA don/doff pants/mesh underwear seated EOC - no AD for standing portion. Pt tolerated multiple sit<>stand trials from various heights with SBA only. Pt demonstrated good recall of adapted dressing techniques. Instructed on polar care mgmt, DME recs, and home/routines modifications. Pt would benefit from skilled OT to address noted impairments and functional limitations (see below for any additional details) in order to maximize safety and independence while minimizing falls risk and caregiver burden. Upon hospital discharge, recommend HHOT to maximize pt safety and return to functional independence during meaningful occupations of daily life.     Follow Up Recommendations  Home health OT    Equipment Recommendations  3 in 1 bedside commode;Tub/shower bench;Other (comment) (RW)    Recommendations for Other Services       Precautions / Restrictions Precautions Precautions: Fall Restrictions Weight Bearing Restrictions: Yes LLE Weight Bearing: Weight bearing as tolerated      Mobility Bed Mobility Overal bed mobility: Modified Independent             General bed mobility comments: pt received and left up in chair    Transfers Overall  transfer level: Needs assistance Equipment used: Rolling walker (2 wheeled) Transfers: Sit to/from Stand Sit to Stand: Supervision         General transfer comment: SBA from chair height and elevated toilet height    Balance Overall balance assessment: Needs assistance Sitting-balance support: Feet supported;No upper extremity supported Sitting balance-Leahy Scale: Good     Standing balance support: Bilateral upper extremity supported Standing balance-Leahy Scale: Fair                             ADL either performed or assessed with clinical judgement   ADL Overall ADL's : Needs assistance/impaired                                       General ADL Comments: SBA + RW toilet t/f and perihygiene in standing. SBA don/doff pants/mesh underwear seated EOC - no AD for standing portion                  Pertinent Vitals/Pain Pain Assessment: Faces Faces Pain Scale: Hurts little more Pain Location: L knee Pain Descriptors / Indicators: Burning Pain Intervention(s): Limited activity within patient's tolerance;Repositioned;Ice applied     Hand Dominance     Extremity/Trunk Assessment Upper Extremity Assessment Upper Extremity Assessment: Overall WFL for tasks assessed   Lower Extremity Assessment Lower Extremity Assessment: Generalized weakness       Communication Communication Communication: No difficulties   Cognition Arousal/Alertness: Awake/alert Behavior During Therapy: WFL for tasks assessed/performed Overall Cognitive Status: Within Functional Limits for tasks assessed  General Comments       Exercises Exercises: Other exercises Other Exercises Other Exercises: Pt educated re: OT role, DME recs, d/c recs, falls prevention, ECS, polar care mgmt, home/routines modifications   Shoulder Instructions      Home Living Family/patient expects to be discharged to:: Private  residence Living Arrangements: Alone Available Help at Discharge: Family;Available PRN/intermittently Type of Home: Other(Comment) (in law suite at daughters house) Home Access: Stairs to enter [employer] of Steps: 3 steps -> platform -> chair lift Entrance Stairs-Rails: Left Home Layout: One level     Bathroom Shower/Tub: Walk-in shower         Home Equipment: Kasandra Knudsen - single point          Prior Functioning/Environment Level of Independence: Independent        Comments: Mod indep for ADLs, household and limited community mobilization; intermittent use of SPC "on bad days" or when outside of home.  Does endorse multiple fall history.  No home O2.        OT Problem List: Decreased strength;Impaired balance (sitting and/or standing);Decreased range of motion;Decreased activity tolerance;Decreased knowledge of use of DME or AE;Cardiopulmonary status limiting activity;Pain      OT Treatment/Interventions: Self-care/ADL training;DME and/or AE instruction;Therapeutic activities;Balance training;Therapeutic exercise;Energy conservation;Patient/family education    OT Goals(Current goals can be found in the care plan section) Acute Rehab OT Goals Patient Stated Goal: to go home from here! OT Goal Formulation: With patient Time For Goal Achievement: 08/30/20 Potential to Achieve Goals: Good ADL Goals Pt Will Perform Grooming: with modified independence;standing (c LRAD PRN) Pt Will Perform Lower Body Dressing: with modified independence;sit to/from stand (c LRAD PRN) Pt Will Transfer to Toilet: with modified independence;ambulating;regular height toilet (c LRAD PRN)  OT Frequency: Min 1X/week              AM-PAC OT "6 Clicks" Daily Activity     Outcome Measure Help from another person eating meals?: None Help from another person taking care of personal grooming?: A Little Help from another person toileting, which includes using toliet, bedpan, or urinal?: A  Little Help from another person bathing (including washing, rinsing, drying)?: A Little Help from another person to put on and taking off regular upper body clothing?: A Little Help from another person to put on and taking off regular lower body clothing?: A Little 6 Click Score: 19   End of Session Equipment Utilized During Treatment: Rolling walker  Activity Tolerance: Patient tolerated treatment well Patient left: in chair;with call bell/phone within reach;with chair alarm set  OT Visit Diagnosis: Unsteadiness on feet (R26.81);Muscle weakness (generalized) (M62.81)                Time: 5732-2025 OT Time Calculation (min): 27 min Charges:  OT General Charges $OT Visit: 1 Visit OT Evaluation $OT Eval Low Complexity: 1 Low OT Treatments $Self Care/Home Management : 23-37 mins  Dessie Coma, M.S. OTR/L  08/16/20, 11:15 AM  ascom [phone]

## 2020-08-16 NOTE — TOC Initial Note (Addendum)
Transition of Care [facility]) - Initial/Assessment Note    Patient Details  Name: Kimberly Woodward MRN: [id number] Date of Birth: 1950-12-24  Transition of Care [facility]) CM/SW Contact:    Candie Chroman, LCSW Phone Number: 08/16/2020, 2:20 PM  Clinical Narrative:  CSW called patient in the room, introduced role, and explained that therapy recommendations would be discussed. Patient is agreeable to home health. No agency preference. She is familiar with [facility]. Left message for representative. Patient is agreeable to DME recommendations for 3-in-1 and rolling walker. Will order after [facility] referral is made. Per patient, plan for discharge tomorrow. Her daughter will pick her up. No further concerns. CSW encouraged patient to contact CSW as needed. CSW will continue to follow patient for support and facilitate return home when stable.    3:41 pm: [facility] is able to accept referral. Asked MD to enter [facility] and DME orders.             3:49 pm: [place] to order walker, 3-in-1, and oxygen.  Expected Discharge Plan: [place] Barriers to Discharge: Continued Medical Work up   Patient Goals and CMS Choice        Expected Discharge Plan and Services Expected Discharge Plan: [place] Acute Care Choice: [place] arrangements for the past 2 months: Single Family Home                                      Prior Living Arrangements/Services Living arrangements for the past 2 months: Single Family Home Lives with:: Self Patient language and need for interpreter reviewed:: Yes Do you feel safe going back to the place where you live?: Yes      Need for Family Participation in Patient Care: Yes (Comment) Care giver support system in place?: Yes (comment)   Criminal Activity/Legal Involvement Pertinent to Current Situation/Hospitalization: No - Comment as needed  Activities of Daily Living Home  Assistive Devices/Equipment: None ADL Screening (condition at time of admission) Patient's cognitive ability adequate to safely complete daily activities?: Yes Is the patient deaf or have difficulty hearing?: No Does the patient have difficulty seeing, even when wearing glasses/contacts?: No Does the patient have difficulty concentrating, remembering, or making decisions?: No Patient able to express need for assistance with ADLs?: Yes Does the patient have difficulty dressing or bathing?: Yes Independently performs ADLs?: Yes (appropriate for developmental age) Does the patient have difficulty walking or climbing stairs?: Yes Weakness of Legs: Left Weakness of Arms/Hands: None  Permission Sought/Granted Permission sought to share information with : Facility Art therapist granted to share information with : Yes, Verbal Permission Granted     Permission granted to share info w AGENCY: Home Health Agencies        Emotional Assessment Appearance:: Appears stated age Attitude/Demeanor/Rapport: Engaged,Gracious Affect (typically observed): Accepting,Appropriate,Calm,Pleasant Orientation: : Oriented to Self,Oriented to Place,Oriented to  Time,Oriented to Situation Alcohol / Substance Use: Not Applicable Psych Involvement: No (comment)  Admission diagnosis:  Hyponatremia [E87.1] COPD exacerbation (HCC) [J44.1] Acute cystitis without hematuria [N30.00] Patient Active Problem List   Diagnosis Date Noted  . Status post total knee replacement using cement, right 08/05/2020  . Status post total knee replacement using cement, left 08/03/2020  . Adrenal insufficiency (Addison's disease) ([place]) 08/08/2018  . Hyponatremia 08/02/2018  . Hyperkalemia 08/02/2018  . Acute kidney  failure ([place]) 08/02/2018  . Obesity (BMI 35.0-39.9 without comorbidity) 01/15/2018  . Diabetes mellitus type 2, uncomplicated ([place]) 83/33/8329  . Bilateral carotid bruits 08/23/2017  . Atherosclerotic  stenosis of innominate artery 08/23/2017  . Nausea & vomiting 08/17/2017  . CAD (coronary artery disease) 06/13/2016  . Essential hypertension 10/15/2013  . COPD (chronic obstructive pulmonary disease) ([place]) 10/15/2013  . Chest pain, unspecified 10/15/2013  . Family history of malignant neoplasm of gastrointestinal tract 07/23/2013  . Diarrhea 07/23/2013  . Benign neoplasm of colon 07/23/2013   PCP:  Angelene Giovanni [facility]:   [id number] - [address], [place] AT [facility] OF [address] & [address] [place] [address] Phone: [phone] Fax: [phone]     Social Determinants of Health (SDOH) Interventions    Readmission Risk Interventions No flowsheet data found.

## 2020-08-16 NOTE — Progress Notes (Signed)
Patient is 13 days s/p a left total knee arthroplasty.  Patient states she is doing well, denies any fevers or chills or purulent drainage from the left knee. Current plan is for discharge home with HHPT instead of SNF.  Staples were removed today without complication, steri-strips applied.  No erythema or purlent drainage noted to the left knee, negative Homan's.  Patient can get the knee wet at this time but avoid submerging the knee in water for one week.  Patient's outpatient appointment on Wednesday will be cancelled, follow-up with Dr. Roland Rack in four weeks for x-rays of the left knee.  She can contact the office with any concerns or questions.  Raquel Zaidan Keeble, PA-C [place]

## 2020-08-16 NOTE — Evaluation (Signed)
Physical Therapy Evaluation Patient Details Name: DANIYAH FOHL MRN: [id number] DOB: [birth year] Today's Date: 08/16/2020   History of Present Illness  presented to ER secondary to SOB, abnormal lab values; admitted for management of acute hyponatremia, UTI.  s/p L TKR, WBAT (08/03/2020)  Clinical Impression  Upon evaluation, patient alert and oriented; follows commands and demonstrates good effort with mobility tasks.  Per faces scale, pain grossly 2-4/10, evidenced by grimacing with movement (especially flexion).  L knee strength (3-/5) and ROM (5-80 degrees) grossly functional for basic transfers and mobility; generally limited by pain.  Currently requiring cga/min assist for sit/stand, basic transfers and gait (200') with RW, cga/close sup.  Demonstrates reciprocal stepping pattern; mildly antalgic L LE. Min cuing for L TKE in loading phases of gait; however, overall fair/good stability and control with loading phases of gait. Would benefit from skilled PT to address above deficits and promote optimal return to PLOF.; Recommend transition to HHPT upon discharge from acute hospitalization.   SaO2 on room air at rest = 93% SaO2 on room air while ambulating = 81% SaO2 on 3 liters of O2 while ambulating = 94%     Follow Up Recommendations Home health PT    Equipment Recommendations  Rolling walker with 5" wheels;3in1 (PT)    Recommendations for Other Services       Precautions / Restrictions Precautions Precautions: Fall Restrictions Weight Bearing Restrictions: Yes LLE Weight Bearing: Weight bearing as tolerated      Mobility  Bed Mobility Overal bed mobility: Modified Independent                  Transfers Overall transfer level: Needs assistance Equipment used: Rolling walker (2 wheeled) Transfers: Sit to/from Stand Sit to Stand: Min guard;Supervision         General transfer comment: good hand placement; maintains L LE anterior to BOS, limited active use  with movement transitions (educated in importance of L knee flexion with functional tasks)  Ambulation/Gait Ambulation/Gait assistance: Min guard;Supervision Gait Distance (Feet): 200 Feet Assistive device: Rolling walker (2 wheeled)       General Gait Details: reciprocal stepping pattern; mildly antalgic L LE. Min cuing for L TKE in loading phases of gait  Stairs            Wheelchair Mobility    Modified Rankin (Stroke Patients Only)       Balance Overall balance assessment: Needs assistance Sitting-balance support: Feet supported;No upper extremity supported Sitting balance-Leahy Scale: Good     Standing balance support: Bilateral upper extremity supported Standing balance-Leahy Scale: Fair                               Pertinent Vitals/Pain Pain Assessment: Faces Faces Pain Scale: Hurts little more Pain Location: L knee Pain Descriptors / Indicators: Aching;Grimacing;Guarding Pain Intervention(s): Limited activity within patient's tolerance;Monitored during session;Repositioned    Home Living Family/patient expects to be discharged to:: Private residence Living Arrangements: Alone Available Help at Discharge: Family;Available PRN/intermittently Type of Home:  (in-law suite at daughter's home) Home Access: Stairs to enter Entrance Stairs-Rails: Left Entrance Stairs-Number of Steps: 3 steps -> platform -> chair lift Home Layout: One level Home Equipment: Cane - single point      Prior Function Level of Independence: Independent         Comments: Mod indep for ADLs, household and limited community mobilization; intermittent use of SPC "on bad days" or when outside  of home.  Does endorse multiple fall history.  No home O2.     Hand Dominance        Extremity/Trunk Assessment   Upper Extremity Assessment Upper Extremity Assessment: Overall WFL for tasks assessed    Lower Extremity Assessment Lower Extremity Assessment: Generalized  weakness (grossly 3-/5 L knee, ROM grossly 5-80 degrees (limited by pain))       Communication   Communication: No difficulties  Cognition Arousal/Alertness: Awake/alert Behavior During Therapy: WFL for tasks assessed/performed Overall Cognitive Status: Within Functional Limits for tasks assessed                                        General Comments      Exercises Total Joint Exercises Goniometric ROM: L knee: 5-80 degrees, limited by pain Other Exercises Other Exercises: Educated in safety with RW, O2 management, importance of L knee flex stretching; patient voiced understanding.   Assessment/Plan    PT Assessment Patient needs continued PT services  PT Problem List Decreased strength;Decreased range of motion;Decreased activity tolerance;Decreased mobility;Decreased coordination;Decreased cognition;Decreased knowledge of use of DME;Decreased balance;Decreased safety awareness;Decreased knowledge of precautions;Decreased skin integrity;Pain       PT Treatment Interventions DME instruction;Gait training;Stair training;Functional mobility training;Therapeutic activities;Therapeutic exercise;Balance training;Patient/family education    PT Goals (Current goals can be found in the Care Plan section)  Acute Rehab PT Goals Patient Stated Goal: to go home from here! PT Goal Formulation: With patient Time For Goal Achievement: 08/30/20 Potential to Achieve Goals: Good    Frequency     Barriers to discharge        Co-evaluation               AM-PAC PT "6 Clicks" Mobility  Outcome Measure Help needed turning from your back to your side while in a flat bed without using bedrails?: None Help needed moving from lying on your back to sitting on the side of a flat bed without using bedrails?: None Help needed moving to and from a bed to a chair (including a wheelchair)?: A Little Help needed standing up from a chair using your arms (e.g., wheelchair or  bedside chair)?: A Little Help needed to walk in hospital room?: A Little Help needed climbing 3-5 steps with a railing? : A Little 6 Click Score: 20    End of Session Equipment Utilized During Treatment: Gait belt Activity Tolerance: Patient tolerated treatment well Patient left: in chair;with call bell/phone within reach (OT present in room to complete assessment; to connect alarm once done) Nurse Communication: Mobility status PT Visit Diagnosis: Muscle weakness (generalized) (M62.81);Difficulty in walking, not elsewhere classified (R26.2);Pain Pain - Right/Left: Left Pain - part of body: Knee    Time: 1610-9604 PT Time Calculation (min) (ACUTE ONLY): 21 min   Charges:   PT Evaluation $PT Eval Moderate Complexity: 1 Mod PT Treatments $Therapeutic Activity: 8-22 mins       Eagan Shifflett H. Owens Shark, PT, DPT, NCS 08/16/20, 10:32 AM [phone]

## 2020-08-16 NOTE — Clinical Social Work Note (Signed)
CSW working remote today. Tried calling patient in room to discuss [facility] recommendations. No answer. Will try again later.  [place] Scrape, [place]

## 2020-08-16 NOTE — Plan of Care (Signed)
  Problem: Health Behavior/Discharge Planning: Goal: Ability to manage health-related needs will improve Outcome: Progressing   Problem: Clinical Measurements: Goal: Will remain free from infection Outcome: Progressing   

## 2020-08-16 NOTE — Progress Notes (Signed)
PROGRESS NOTE    Kimberly Woodward   QQV:[id number]  DOB: 1950-09-17  PCP: [facility]    DOA: 08/14/2020 LOS: 2   Brief Narrative   Kimberly Woodward is a 70 y.o. Caucasian female with medical history significant for Addison's disease, COPD recently started on 3 L/min home oxygen, CAD s/p PCI in 2015, HTN, HLD, GERD and left bundle branch block who presented to there ED on 08/14/20 from her rehab facility where she has been since 08/10/2020 after having left total knee arthroplasty.  She was found to have hyponatremia on labs, sodium of 124, and referred to hospital for evaluation.  She reported generalized fatigue, tiredness, mild dyspnea without cough or wheezing, some chills, a migraine headache, nausea without vomiting, and dysuria for past 3 days with urgency.   Initial evaluation notable for Na 120, Cl 85, T bili 1.3, stable anemia Hbg 9.4.  D-dimer elevated 3.27.  CTA chest negative for PE, showed emphysema and diffuse bronchial wall thickening c/w infectious or inflammatory bronchitis.  UA was suspicious for infection with positive nitrite, sent for culture.   Treated with albuterol neb, IV Levaquin, 1.5 L NS.   She was premedicated for CTA with IV Benadryl and Solu-Cortef due to documented allergy, tolerated the scan without reactions.  Admitted to hospitalist service for further management.    Assessment & Plan   Active Problems:   Hyponatremia    Euvolemic Hypotonic Hyponatremia - POA with Na 120.  Seems due to adrenal insufficiency and dehydration. Recent stress of knee replacement surgery, poor PO intake at SNF. Pt has hx of adrenal insufficiency based on chart review. Has not recently been on hydrocortisone outpatient, based on brief chart review in [place]. Serum osm 258 / Ur osm 311 / Ur Na 74. Med hx pending but appears she takes PO Lasix 20 mg. Na improved 120 >> 126 >>124>>128  Treated with NS from admission>>4/11. Stopped  fluids. Cortisol AM is low, hx of AI, pt has salt craving. PLAN: --Stop IV fluids --Give IV Solu-cortef today, d/c on PO --Follow up with endocrinology and/or PCP closely --repeat Na level this afternoon --hold Lasix, Maxzide (verify med hx), monitor volume status --Follow BMP's  Urinary tract infection - POA. Did not meet sepsis criteria on admission.  Pt with dysuria and urgency. --Continue Levaquin to complete 3 days --Follow up urine cx  Recent Right Total Knee Replacement - admitted from SNF, but was about to go home with [facility]. --PT and OT recommend Home with HH --Continue Polar Cube, follow Ortho's recs --VTE prophylaxis per ortho: continue Lovenox for two weeks prior to returning to Plavix (per d/c summary 3/30) --Staples removed at bedside today --Follow up with Dr. Roland Rack for xray in 4 weeks --Okay to get knee wet in shower, wait another week to submerge in water  Hx of Addison's disease - noted in chart review.  Med history not completed yet, but last med history reflects she was no longer taking hydrocortisone, most recent PCP note also does not list this on med list. --AM cortisol level was low 2.8 --Given known history, no need for stim test --IV Solu-cortef today and d/c on oral. --Monitor BP, BMP  Acute bronchitis with chronic respiratory failure on home O2 at 3 L/min -  --On Levaquin as above.and will add duo nebs 4 times daily --Duonebs scheduled and PRN's --Titrate supplemental O2 for sats >= 90%  Essential hypertension - continue Norvasc and carvedilol. Low diastolic BP's, Coreg was held  Depression - continue Lexapro, Cymbalta  GERD - continue PPI  Coronary artery disease - stable, s/p PCI in 2015. No chest pain.  Continue statin, Imdur, Coreg, ASA, Plavix  Obesity: Body mass index is 32.24 kg/m.  Complicates overall care and prognosis.  Recommend lifestyle modifications including physical activity and diet for weight loss and overall long-term  health.   DVT prophylaxis:  Lovenox   Diet:  Diet Orders (From admission, onward)    Start     Ordered   08/15/20 1245  Diet regular Room service appropriate? Yes; Fluid consistency: Thin  Diet effective now       Comments: Please send patient a Dr. Malachi Bonds with meals. Thank you  Question Answer Comment  Room service appropriate? Yes   Fluid consistency: Thin      08/15/20 1245            Code Status: Full Code    Subjective 08/16/20    Pt up in chair today.  Reports doing well, happy therapy recommended HH.  No dysuria, fever/chills or other complaints.  Hopeful to return home tomorrow.   Disposition Plan & Communication   Status is: Inpatient  Inpatient status is appropriate due to ongoing electrolyte abnormalities requiring close monitoring, IV therapies, ongoing evaluation for hyponatremia.  Expect d/c tomorrow if stable and improved.    Dispo: The patient is from: SNF              Anticipated d/c is to: home with [facility]              Patient currently Not medically stable for d/c.    Difficult to place patient No    Consults, Procedures, Significant Events   Consultants:   None  Procedures:   None  Antimicrobials:  Anti-infectives (From admission, onward)   Start     Dose/Rate Route Frequency Ordered Stop   08/15/20 2000  Levofloxacin (LEVAQUIN) IVPB 250 mg        250 mg 50 mL/hr over 60 Minutes Intravenous Every 24 hours 08/15/20 1140 08/17/20 1959   08/15/20 1600  levofloxacin (LEVAQUIN) IVPB 500 mg  Status:  Discontinued        500 mg 100 mL/hr over 60 Minutes Intravenous Every 24 hours 08/15/20 0238 08/15/20 1140   08/14/20 2015  cefTRIAXone (ROCEPHIN) 1 g in sodium chloride 0.9 % 100 mL IVPB  Status:  Discontinued        1 g 200 mL/hr over 30 Minutes Intravenous Every 24 hours 08/14/20 2001 08/14/20 2200   08/14/20 1930  levofloxacin (LEVAQUIN) IVPB 750 mg        750 mg 100 mL/hr over 90 Minutes Intravenous  Once 08/14/20 1927 08/14/20 2020         Micro    Objective   Vitals:   08/16/20 1130 08/16/20 1440 08/16/20 1527 08/16/20 1601  BP:  (!) 102/36  (!) 139/55  Pulse:  70  74  Resp:  16  15  Temp:  (!) 97.5 F (36.4 C)  98.5 F (36.9 C)  TempSrc:  Oral  Oral  SpO2: 100% 98% 98% 99%  Weight:      Height:        Intake/Output Summary (Last 24 hours) at 08/16/2020 1637 Last data filed at 08/16/2020 1600 Gross per 24 hour  Intake 4122.06 ml  Output 1400 ml  Net 2722.06 ml   Filed Weights   08/14/20 1432  Weight: 82.6 kg    Physical Exam:  General exam:  awake, alert, no acute distress moist mucus membranes, hearing grossly normal  Respiratory system: on 2 l/min [place] O2, normal respiratory effort, diminished but no wheezes Cardiovascular system: normal S1/S2, RRR, no JVD, murmurs, rubs, gallops Central nervous system: A&O x4. no gross focal neurologic deficits, normal speech Extremities: R leg with polar ice in place, trace b/l pedal edema, normal tone Psychiatry: normal mood, congruent affect, judgement and insight appear normal  Labs   Data Reviewed: I have personally reviewed following labs and imaging studies  CBC: Recent Labs  Lab 08/14/20 1519 08/15/20 0555 08/16/20 0514  WBC 7.9 7.4 5.9  NEUTROABS 5.7  --   --   HGB 9.4* 9.3* 8.6*  HCT 28.5* 28.4* 26.7*  MCV 83.6 83.0 85.3  PLT 501* 463* 785*   Basic Metabolic Panel: Recent Labs  Lab 08/14/20 1639 08/15/20 0555 08/15/20 1358 08/16/20 0514 08/16/20 1535  NA 120* 126* 124* 128* 128*  K 4.7 4.5  --  4.0  --   CL 85* 92*  --  94*  --   CO2 25 27  --  27  --   GLUCOSE 108* 110*  --  96  --   BUN 10 8  --  8  --   CREATININE 0.72 0.76  --  0.67  --   CALCIUM 8.6* 9.1  --  8.9  --    GFR: Estimated Creatinine Clearance: 67.6 mL/min (by C-G formula based on SCr of 0.67 mg/dL). Liver Function Tests: Recent Labs  Lab 08/14/20 1639  AST 32  ALT 23  ALKPHOS 83  BILITOT 1.3*  PROT 6.9  ALBUMIN 3.2*   No results for input(s): LIPASE,  AMYLASE in the last 168 hours. No results for input(s): AMMONIA in the last 168 hours. Coagulation Profile: No results for input(s): INR, PROTIME in the last 168 hours. Cardiac Enzymes: No results for input(s): CKTOTAL, CKMB, CKMBINDEX, TROPONINI in the last 168 hours. BNP (last 3 results) No results for input(s): PROBNP in the last 8760 hours. HbA1C: No results for input(s): HGBA1C in the last 72 hours. CBG: No results for input(s): GLUCAP in the last 168 hours. Lipid Profile: No results for input(s): CHOL, HDL, LDLCALC, TRIG, CHOLHDL, LDLDIRECT in the last 72 hours. Thyroid Function Tests: Recent Labs    08/16/20 0514  TSH 4.674*   Anemia Panel: No results for input(s): VITAMINB12, FOLATE, FERRITIN, TIBC, IRON, RETICCTPCT in the last 72 hours. Sepsis Labs: No results for input(s): PROCALCITON, LATICACIDVEN in the last 168 hours.  Recent Results (from the past 240 hour(s))  SARS CORONAVIRUS 2 (TAT 6-24 HRS) Nasopharyngeal Nasopharyngeal Swab     Status: None   Collection Time: 08/08/20  5:27 PM   Specimen: Nasopharyngeal Swab  Result Value Ref Range Status   SARS Coronavirus 2 NEGATIVE NEGATIVE Final    Comment: (NOTE) SARS-CoV-2 target nucleic acids are NOT DETECTED.  The SARS-CoV-2 RNA is generally detectable in upper and lower respiratory specimens during the acute phase of infection. Negative results do not preclude SARS-CoV-2 infection, do not rule out co-infections with other pathogens, and should not be used as the sole basis for treatment or other patient management decisions. Negative results must be combined with clinical observations, patient history, and epidemiological information. The expected result is Negative.  Fact Sheet for Patients: SugarRoll.be  Fact Sheet for Healthcare Providers: https://www.woods-mathews.com/  This test is not yet approved or cleared by the [place] FDA and  has been authorized for  detection and/or diagnosis of SARS-CoV-2  by FDA under an Emergency Use Authorization (EUA). This EUA will remain  in effect (meaning this test can be used) for the duration of the COVID-19 declaration under Se ction 564(b)(1) of the Act, 21 U.S.C. section 360bbb-3(b)(1), unless the authorization is terminated or revoked sooner.  Performed at [facility], [place] [address] [place], [place] [address]   Urine Culture     Status: Abnormal (Preliminary result)   Collection Time: 08/14/20  3:19 PM   Specimen: Urine, Random  Result Value Ref Range Status   Specimen Description   Final    URINE, RANDOM Performed at [facility], [address], [place], [place] [address]    Special Requests   Final    NONE Performed at [facility], [address] [place], [place] [address]    Culture (A)  Final    >=100,000 COLONIES/mL ESCHERICHIA COLI SUSCEPTIBILITIES TO FOLLOW Performed at [facility], [place] [address] [place], [place] [address]    Report Status PENDING  Incomplete      Imaging Studies   CT Angio Chest PE W and/or Wo Contrast  Result Date: 08/14/2020 CLINICAL DATA:  PE suspected, recent surgery with altered anticoagulation EXAM: CT ANGIOGRAPHY CHEST WITH CONTRAST TECHNIQUE: Multidetector CT imaging of the chest was performed using the standard protocol during bolus administration of intravenous contrast. Multiplanar CT image reconstructions and MIPs were obtained to evaluate the vascular anatomy. CONTRAST:  67mL OMNIPAQUE IOHEXOL 350 MG/ML SOLN COMPARISON:  08/06/2020 FINDINGS: Cardiovascular: Satisfactory opacification of the pulmonary arteries to the segmental level. No evidence of pulmonary embolism. Normal heart size. Three-vessel coronary artery calcifications and/or stents. Trace pericardial effusion. Aortic atherosclerosis. Innominate artery stent. Mediastinum/Nodes: No enlarged mediastinal, hilar, or axillary lymph nodes. Thyroid gland, trachea,  and esophagus demonstrate no significant findings. Lungs/Pleura: Moderate centrilobular emphysema. Diffuse bilateral bronchial wall thickening. No pleural effusion or pneumothorax. Upper Abdomen: No acute abnormality. Musculoskeletal: No chest wall abnormality. No acute or significant osseous findings. Review of the MIP images confirms the above findings. IMPRESSION: 1. Negative examination for pulmonary embolism. 2. Moderate centrilobular emphysema with diffuse bilateral bronchial wall thickening, consistent with nonspecific infectious or inflammatory bronchitis. 3. Coronary artery disease. Aortic Atherosclerosis (ICD10-I70.0) and Emphysema (ICD10-J43.9). Electronically Signed   By: Eddie Candle M.D.   On: 08/14/2020 19:00     Medications   Scheduled Meds: . ALPRAZolam  0.25 mg Oral QHS  . amLODipine  10 mg Oral Q1500  . aspirin  81 mg Oral Daily  . atorvastatin  10 mg Oral Daily  . carvedilol  6.25 mg Oral BID WC  . cholecalciferol  1,000 Units Oral Daily  . clopidogrel  75 mg Oral Daily  . DULoxetine  30 mg Oral Daily  . enoxaparin (LOVENOX) injection  0.5 mg/kg Subcutaneous Q24H  . escitalopram  10 mg Oral Daily  . gabapentin  300 mg Oral QHS  . ipratropium-albuterol  3 mL Nebulization QID  . multivitamin with minerals  1 tablet Oral Daily  . pantoprazole  40 mg Oral Daily  . polyethylene glycol  17 g Oral Daily  . senna-docusate  1 tablet Oral BID  . tamsulosin  0.4 mg Oral Daily  . vitamin B-12  1,000 mcg Oral Daily   Continuous Infusions: . levofloxacin (LEVAQUIN) IV 250 mg (08/15/20 2122)       LOS: 2 days    Time spent: 30 minutes with > 50% spent at bedside and in coordination of care.    Claiborne Billings  Iantha Fallen, DO [facility]  08/16/2020, 4:37 PM      If 7PM-7AM, please contact night-coverage. How to contact the [facility] Attending or Consulting provider [place] or covering provider during after hours [place], for this patient?    1. Check the care team in [facility]  and look for a) attending/consulting [facility] provider listed and b) the [facility] team listed 2. Log into [email or web address] and use [place]'s universal password to access. If you do not have the password, please contact the hospital operator. 3. Locate the [facility] provider you are looking for under [facility] and page to a number that you can be directly reached. 4. If you still have difficulty reaching the provider, please page the [facility] (Director on Call) for the Hospitalists listed on amion for assistance.

## 2020-08-17 LAB — URINE CULTURE: Culture: >=100000 — AB

## 2020-08-17 LAB — BASIC METABOLIC PANEL
Anion gap: 6 (ref 5–15)
BUN: 7 mg/dL — ABNORMAL LOW (ref 8–23)
CO2: 27 mmol/L (ref 22–32)
Calcium: 8.8 mg/dL — ABNORMAL LOW (ref 8.9–10.3)
Chloride: 94 mmol/L — ABNORMAL LOW (ref 98–111)
Creatinine, Ser: 0.55 mg/dL (ref 0.44–1.00)
GFR, Estimated: >60 mL/min (ref >60)
Glucose, Bld: 110 mg/dL — ABNORMAL HIGH (ref 70–99)
Potassium: 4.8 mmol/L (ref 3.5–5.1)
Sodium: 127 mmol/L — ABNORMAL LOW (ref 135–145)

## 2020-08-17 LAB — CBC
HCT: 25.2 % — ABNORMAL LOW (ref 36.0–46.0)
Hemoglobin: 8.2 g/dL — ABNORMAL LOW (ref 12.0–15.0)
MCH: 27.9 pg (ref 26.0–34.0)
MCHC: 32.5 g/dL (ref 30.0–36.0)
MCV: 85.7 fL (ref 80.0–100.0)
Platelets: 445 10*3/uL — ABNORMAL HIGH (ref 150–400)
RBC: 2.94 MIL/uL — ABNORMAL LOW (ref 3.87–5.11)
RDW: 14.7 % (ref 11.5–15.5)
WBC: 7.4 10*3/uL (ref 4.0–10.5)
nRBC: 0 % (ref 0.0–0.2)

## 2020-08-17 LAB — T4, FREE: Free T4: 1.24 ng/dL — ABNORMAL HIGH (ref 0.61–1.12)

## 2020-08-17 MED ORDER — IPRATROPIUM-ALBUTEROL 0.5-2.5 (3) MG/3ML IN SOLN: 3.0000 mL | Freq: Three times a day (TID) | RESPIRATORY_TRACT | Status: DC

## 2020-08-17 MED ORDER — HYDROCORTISONE 10 MG PO TABS: 10.0000 mg | ORAL_TABLET | Freq: Two times a day (BID) | ORAL | 0 refills | Status: DC

## 2020-08-17 NOTE — Progress Notes (Signed)
All discharge instructions and prescriptions reviewed with patient and she verbalized understanding. All personal belongings with patient at time of discharge. Patient is awaiting family member arrival. No further concerns noted

## 2020-08-17 NOTE — TOC Transition Note (Signed)
Transition of Care [facility]) - CM/SW Discharge Note   Patient Details  Name: Kimberly Woodward MRN: [id number] Date of Birth: [birth year]  Transition of Care [facility] CM/SW Contact:  Candie Chroman, LCSW Phone Number: 08/17/2020, 2:02 PM   Clinical Narrative:   Patient has orders to discharge home today. [employer] representative said DME was delivered to the room this morning. Left message for [facility] representative to notify her that discharge orders were in. No further concerns. CSW signing off.  Final next level of care: [place] Barriers to Discharge: Barriers Resolved   Patient Goals and CMS Choice        Discharge Placement                    Patient and family notified of of transfer: 08/17/20  Discharge Plan and Services     Post Acute Care Choice: [place]          DME Arranged: 3-N-1,[place],Oxygen DME Agency: [employer] Date DME Agency Contacted: 08/17/20   Representative spoke with at DME Agency: [place]: RN,PT,OT,Nurse's Aide [place]: Well [place] Date [facility] Agency Contacted: 08/17/20   Representative spoke with at [place]: Jana Half  Social Determinants of Health ([place]) Interventions     Readmission Risk Interventions No flowsheet data found.

## 2020-08-17 NOTE — Plan of Care (Signed)
  Problem: Health Behavior/Discharge Planning: Goal: Ability to manage health-related needs will improve 08/17/2020 0316 by Marylene Buerger, RN Outcome: Progressing 08/17/2020 0316 by Marylene Buerger, RN Outcome: Progressing   Problem: Clinical Measurements: Goal: Will remain free from infection 08/17/2020 0316 by Marylene Buerger, RN Outcome: Progressing 08/17/2020 0316 by Marylene Buerger, RN Outcome: Progressing

## 2020-08-17 NOTE — Discharge Summary (Signed)
Physician Discharge Summary  Kimberly Woodward GYI:[id number] DOB: December 05, 1950 DOA: 08/14/2020  PCP: [facility]  Admit date: 08/14/2020 Discharge date: 08/17/2020  Admitted From: SNF Disposition:  home  Recommendations for Outpatient Follow-up:  1. Follow up with PCP in 1-2 weeks 2. Please obtain BMP/CBC in one week 3. Please follow up on patient's Blood Pressure.  Antihypertensives were stopped on d/c as patient's BP was controlled and diastolic pressures were running low.  Please assess and resume medications when indicated 4. Follow up with endocrinology for adrenal insufficiency.  Patient was resumed on Solu-Cortef.  Appears adrenal insufficiency recurred after the stress of her knee surgery.    Home Health: PT, OT, RN, Aide Equipment/Devices: rolling walker, 3-n-1, oxygen   Discharge Condition: stable  CODE STATUS: full  Diet recommendation: Regular   Discharge Diagnoses: Active Problems:   Hyponatremia    Summary of HPI and Hospital Course:  Kimberly Woodward is a 70 y.o. Caucasian female with medical history significant for Addison's disease, COPD recently started on 3 L/min home oxygen, CAD s/p PCI in 2015, HTN, HLD, GERD and left bundle branch block who presented to there ED on 08/14/20 from her rehab facility where she has been since 08/10/2020 after having left total knee arthroplasty.  She was found to have hyponatremia on labs, sodium of 124, and referred to hospital for evaluation.  She reported generalized fatigue, tiredness, mild dyspnea without cough or wheezing, some chills, a migraine headache, nausea without vomiting, and dysuria for past 3 days with urgency.   Initial evaluation notable for Na 120, Cl 85, T bili 1.3, stable anemia Hbg 9.4.  D-dimer elevated 3.27.  CTA chest negative for PE, showed emphysema and diffuse bronchial wall thickening c/w infectious or inflammatory bronchitis.  UA was suspicious for infection with positive nitrite, sent for  culture.   Treated with albuterol neb, IV Levaquin, 1.5 L NS.   She was premedicated for CTA with IV Benadryl and Solu-Cortef due to documented allergy, tolerated the scan without reactions.  Admitted to hospitalist service for further management.     Euvolemic Hypotonic Hyponatremia - POA with Na 120.  Seems due to adrenal insufficiency, meds, and dehydration. Recent stress of knee replacement surgery, poor PO intake at SNF. Pt has hx of adrenal insufficiency based on chart review.  Has not recently been on hydrocortisone outpatient, based on brief chart review in [place]. Serum osm 258 / Ur osm 311 / Ur Na 74. Med hx pending but appears she takes Lasix and HCTZ Na improved 120 >> 126 >>124>>128  Treated with NS from admission>>4/11. Stopped fluids. Cortisol AM is low, hx of AI, pt has salt craving. Treated with IV Solu-cortef today, d/c on PO Follow up with endocrinology and/or PCP closely Hold Lasix, Maxzide for now Close outpatient follow up for BMP/sodium level, BP  Urinary tract infection - POA. Did not meet sepsis criteria on admission.  Pt with dysuria and urgency.  Treated with Levaquin to complete 3 days.    Recent Right Total Knee Replacement - admitted from SNF, but was about to go home with [facility]. --PT and OT recommend Home with HH --Continue Polar Cube, follow Ortho's recs --VTE prophylaxis per ortho: continue Lovenox for two weeks prior to returning to Plavix (per d/c summary 3/30) --Staples removed at bedside today --Follow up with Dr. Roland Rack for xray in 4 weeks --Okay to get knee wet in shower, wait another week to submerge in water  Hx of Addison's disease -  noted in chart review.  Med history not completed yet, but last med history reflects she was no longer taking hydrocortisone, most recent PCP note also does not list this on med list. --AM cortisol level was low 2.8, with prior hx of adrenal insufficiency and recent stressors of surgery and  infection. --treated with IV Solu-cortef today and continued on oral at d/c  --Monitor BP, obtain BMP within one week of d/c --Resumed on prior dose of Solu-Cortef 10 mg PO BID --Follow up endocrinology.  Acute bronchitis with chronic respiratory failure on home O2 at 3 L/min -  --Treated with Levaquin as above, Duonebs  --As needed supplemental O2 to keep sats >= 90%  Essential hypertension - Stopped all BP meds to prevent hypotension or orthostasis.  DBP's were low here and Coreg, amlo were mostly held. Likely related to adrenal insufficiency Close outpatient follow up    Depression - continue Lexapro, Cymbalta  GERD - continue PPI  Coronary artery disease - stable, s/p PCI in 2015. No chest pain.  Continue statin, Imdur, Coreg, ASA, Plavix  Obesity: Body mass index is 32.24 kg/m.  Complicates overall care and prognosis.  Recommend lifestyle modifications including physical activity and diet for weight loss and overall long-term health.     Discharge Instructions   Discharge Instructions    Call MD for:  extreme fatigue   Complete by: As directed    Call MD for:  persistant dizziness or light-headedness   Complete by: As directed    Call MD for:  persistant nausea and vomiting   Complete by: As directed    Call MD for:  severe uncontrolled pain   Complete by: As directed    Call MD for:  temperature >100.4   Complete by: As directed    Diet - low sodium heart healthy   Complete by: As directed    Discharge instructions   Complete by: As directed    I restarted you on Solu-Cortef (hydrocortisone) for adrenal insufficiency.   Your cortisol was low, and adrenal insufficiency causes low sodium levels, low blood pressure, dizziness.  Monitor you BP at home and be sure to see primary care within a week.  We stopped your BP medications because we had to hold them here to prevent dropping your BP too low.  Once you are home, and with time back on Solu-Cortef, your BP  will get higher again.  Your doctor will need to have you restart those medications if/when it is appropriate.   Discharge wound care:   Complete by: As directed    Okay to shower and get the knee wet. Do not submerge in water for another week.   Increase activity slowly   Complete by: As directed      Allergies as of 08/17/2020      Reactions   Ivp Dye [iodinated Diagnostic Agents] Hives   Penicillins Hives   Did it involve swelling of the face/tongue/throat, SOB, or low BP? Unknown Did it involve sudden or severe rash/hives, skin peeling, or any reaction on the inside of your mouth or nose? Yes Did you need to seek medical attention at a hospital or doctor's office? Patient was inpatient when incident occurred When did it last happen?More than 15 years ago If all above answers are "NO", may proceed with cephalosporin use.      Medication List    STOP taking these medications   amLODipine 10 MG tablet Commonly known as: NORVASC   Anoro Ellipta 62.5-25 MCG/INH  Aepb Generic drug: umeclidinium-vilanterol   carvedilol 6.25 MG tablet Commonly known as: COREG   furosemide 20 MG tablet Commonly known as: LASIX   HYDROcodone-acetaminophen 5-325 MG tablet Commonly known as: NORCO/VICODIN   isosorbide mononitrate 30 MG 24 hr tablet Commonly known as: IMDUR   potassium chloride 10 MEQ tablet Commonly known as: KLOR-CON   Stiolto Respimat 2.5-2.5 MCG/ACT Aers Generic drug: Tiotropium Bromide-Olodaterol   triamterene-hydrochlorothiazide 37.5-25 MG tablet Commonly known as: MAXZIDE-25     TAKE these medications   albuterol 108 (90 Base) MCG/ACT inhaler Commonly known as: VENTOLIN HFA Inhale 1-2 puffs into the lungs every 6 (six) hours as needed (wheezing/shortness of breath).   albuterol (2.5 MG/3ML) 0.083% nebulizer solution Commonly known as: PROVENTIL Take 2.5 mg by nebulization every 6 (six) hours as needed for wheezing or shortness of breath.   ALPRAZolam 0.25 MG  tablet Commonly known as: XANAX Take 0.25 mg by mouth at bedtime.   aspirin 81 MG chewable tablet Chew 81 mg by mouth daily.   atorvastatin 10 MG tablet Commonly known as: LIPITOR Take 10 mg by mouth daily.   Breztri Aerosphere 160-9-4.8 MCG/ACT Aero Generic drug: Budeson-Glycopyrrol-Formoterol Inhale into the lungs.   cholecalciferol 25 MCG (1000 UNIT) tablet Commonly known as: VITAMIN D Take 1,000 Units by mouth daily.   clopidogrel 75 MG tablet Commonly known as: PLAVIX Take 75 mg by mouth daily.   diphenhydrAMINE 50 MG tablet Commonly known as: BENADRYL Take 50 mg by mouth at bedtime.   DULoxetine 30 MG capsule Commonly known as: CYMBALTA Take 30 mg by mouth daily.   enoxaparin 40 MG/0.4ML injection Commonly known as: LOVENOX Inject 0.4 mLs (40 mg total) into the skin daily.   escitalopram 10 MG tablet Commonly known as: LEXAPRO Take 10 mg by mouth daily.   esomeprazole 40 MG capsule Commonly known as: NEXIUM Take 40 mg by mouth daily.   gabapentin 300 MG capsule Commonly known as: NEURONTIN Take 300 mg by mouth at bedtime.   hydrocortisone 10 MG tablet Commonly known as: CORTEF Take 1 tablet (10 mg total) by mouth in the morning and at bedtime.   ipratropium 0.02 % nebulizer solution Commonly known as: ATROVENT Take 0.5 mg by nebulization every 4 (four) hours as needed for wheezing or shortness of breath.   multivitamin with minerals Tabs tablet Take 1 tablet by mouth daily.   nitroGLYCERIN 0.4 MG SL tablet Commonly known as: NITROSTAT Place 0.4 mg under the tongue every 5 (five) minutes x 3 doses as needed for chest pain.   tamsulosin 0.4 MG Caps capsule Commonly known as: FLOMAX Take 1 capsule (0.4 mg total) by mouth daily.   traMADol 50 MG tablet Commonly known as: ULTRAM Take 1-2 tablets (50-100 mg total) by mouth every 6 (six) hours as needed for moderate pain.   vitamin B-12 1000 MCG tablet Commonly known as: CYANOCOBALAMIN Take 1,000  mcg by mouth daily.            Durable Medical Equipment  (From admission, onward)         Start     Ordered   08/16/20 1547  For home use only DME Walker rolling  Once       Question Answer Comment  Walker: With 5 Inch Wheels   Patient needs a walker to treat with the following condition Unsteady gait      08/16/20 1547   08/16/20 1547  For home use only DME 3 n 1  Once  08/16/20 1547   08/16/20 1547  For home use only DME oxygen  Once       Question Answer Comment  Length of Need 6 Months   Mode or (Route) Nasal cannula   Liters per Minute 2   Frequency Continuous (stationary and portable oxygen unit needed)   Oxygen delivery system Gas      08/16/20 1547           Discharge Care Instructions  (From admission, onward)         Start     Ordered   08/17/20 0000  Discharge wound care:       Comments: Okay to shower and get the knee wet. Do not submerge in water for another week.   08/17/20 1249          Allergies  Allergen Reactions  . Ivp Dye [Iodinated Diagnostic Agents] Hives  . Penicillins Hives    Did it involve swelling of the face/tongue/throat, SOB, or low BP? Unknown Did it involve sudden or severe rash/hives, skin peeling, or any reaction on the inside of your mouth or nose? Yes Did you need to seek medical attention at a hospital or doctor's office? Patient was inpatient when incident occurred When did it last happen?More than 15 years ago If all above answers are "NO", may proceed with cephalosporin use.      If you experience worsening of your admission symptoms, develop shortness of breath, life threatening emergency, suicidal or homicidal thoughts you must seek medical attention immediately by calling 911 or calling your MD immediately  if symptoms less severe.    Please note   You were cared for by a hospitalist during your hospital stay. If you have any questions about your discharge medications or the care you received while  you were in the hospital after you are discharged, you can call the unit and asked to speak with the hospitalist on call if the hospitalist that took care of you is not available. Once you are discharged, your primary care physician will handle any further medical issues. Please note that NO REFILLS for any discharge medications will be authorized once you are discharged, as it is imperative that you return to your primary care physician (or establish a relationship with a primary care physician if you do not have one) for your aftercare needs so that they can reassess your need for medications and monitor your lab values.   Consultations:  none   Procedures/Studies: DG Chest 2 View  Result Date: 08/14/2020 CLINICAL DATA:  Acute onset shortness of breath. EXAM: CHEST - 2 VIEW COMPARISON:  05/22/2018 and earlier. FINDINGS: Cardiac silhouette mildly enlarged, unchanged. Thoracic aorta severely atherosclerotic, unchanged. RIGHT innominate artery stent. Hilar and mediastinal contours otherwise unremarkable. Mildly prominent bronchovascular markings diffusely and mild central peribronchial thickening, unchanged. Lungs otherwise clear. No localized airspace consolidation. No pleural effusions. No pneumothorax. Normal pulmonary vascularity. Visualized bony thorax unremarkable. IMPRESSION: Stable mild cardiomegaly. Stable mild changes of chronic bronchitis and/or asthma. No acute cardiopulmonary disease. Electronically Signed   By: Evangeline Dakin M.D.   On: 08/14/2020 15:46   CT Angio Chest PE W and/or Wo Contrast  Result Date: 08/14/2020 CLINICAL DATA:  PE suspected, recent surgery with altered anticoagulation EXAM: CT ANGIOGRAPHY CHEST WITH CONTRAST TECHNIQUE: Multidetector CT imaging of the chest was performed using the standard protocol during bolus administration of intravenous contrast. Multiplanar CT image reconstructions and MIPs were obtained to evaluate the vascular anatomy. CONTRAST:  8mL  OMNIPAQUE  IOHEXOL 350 MG/ML SOLN COMPARISON:  08/06/2020 FINDINGS: Cardiovascular: Satisfactory opacification of the pulmonary arteries to the segmental level. No evidence of pulmonary embolism. Normal heart size. Three-vessel coronary artery calcifications and/or stents. Trace pericardial effusion. Aortic atherosclerosis. Innominate artery stent. Mediastinum/Nodes: No enlarged mediastinal, hilar, or axillary lymph nodes. Thyroid gland, trachea, and esophagus demonstrate no significant findings. Lungs/Pleura: Moderate centrilobular emphysema. Diffuse bilateral bronchial wall thickening. No pleural effusion or pneumothorax. Upper Abdomen: No acute abnormality. Musculoskeletal: No chest wall abnormality. No acute or significant osseous findings. Review of the MIP images confirms the above findings. IMPRESSION: 1. Negative examination for pulmonary embolism. 2. Moderate centrilobular emphysema with diffuse bilateral bronchial wall thickening, consistent with nonspecific infectious or inflammatory bronchitis. 3. Coronary artery disease. Aortic Atherosclerosis (ICD10-I70.0) and Emphysema (ICD10-J43.9). Electronically Signed   By: Eddie Candle M.D.   On: 08/14/2020 19:00   CT ANGIO CHEST PE W OR WO CONTRAST  Result Date: 08/06/2020 CLINICAL DATA:  High probability for pulmonary embolism. Shortness of breath and chest pain. Recent knee replacement EXAM: CT ANGIOGRAPHY CHEST WITH CONTRAST TECHNIQUE: Multidetector CT imaging of the chest was performed using the standard protocol during bolus administration of intravenous contrast. Multiplanar CT image reconstructions and MIPs were obtained to evaluate the vascular anatomy. CONTRAST:  38mL OMNIPAQUE IOHEXOL 350 MG/ML SOLN COMPARISON:  None. FINDINGS: Cardiovascular: Normal heart size. No pericardial effusion. Multifocal aortic and coronary atherosclerosis. Heavily calcified great vessel ostia status post right proximal subclavian stenting. There is likely high-grade  narrowing at the left subclavian origin and left common carotid origin. Flow reducing stenosis may be present at the proximal subclavian on the right. For indication is limited by contrast timing and streak artifact. Mediastinum/Nodes: Negative for adenopathy or mass. Lungs/Pleura: Emphysematous changes and diffuse airway thickening. There is no edema, consolidation, effusion, or pneumothorax. Subpleural lobulated nodule in the right upper lobe on 6:27 measuring 6 mm average diameter. Upper Abdomen: Extensive atheromatous plaque.  Cholecystectomy. Musculoskeletal: No acute finding Review of the MIP images confirms the above findings. IMPRESSION: 1. Negative for pulmonary embolism or other acute finding. 2. 6 mm pulmonary nodule in the right upper lobe. There is a background of COPD. Non-contrast chest CT at 6-12 months is recommended. If the nodule is stable at time of repeat CT, then future CT at 18-24 months is recommended for high-risk patients. This recommendation follows the consensus statement: Guidelines for Management of Incidental Pulmonary Nodules Detected on CT Images: From the Fleischner Society 2017; Radiology 2017; 284:228-243. 3. Marked atherosclerosis with multifocal high-grade narrowing of the great vessels. Electronically Signed   By: Monte Fantasia M.D.   On: 08/06/2020 11:23   DG Knee Left Port  Result Date: 08/03/2020 CLINICAL DATA:  Status post total knee replacement using cement. EXAM: PORTABLE LEFT KNEE - 1-2 VIEW COMPARISON:  None. FINDINGS: Left knee arthroplasty in expected alignment. Slight cortical irregularity of the anterior distal femur just above the femoral component, postsurgical. There has been patellar resurfacing. Recent postsurgical change includes air and edema in the soft tissues and joint space. Anterior skin staples. IMPRESSION: Status post left knee arthroplasty without immediate postoperative complication. Electronically Signed   By: Keith Rake M.D.   On:  08/03/2020 15:22       Subjective: Pt feels well. Up in recliner.  Knee doing well with polar cube.  Staples removed and steri strips placed.  No fever or chills or other acute complaints.  Happy to be going home   Discharge Exam: Vitals:   08/17/20 0737 08/17/20  1219  BP:  (!) 118/42  Pulse:  74  Resp:  18  Temp:  97.7 F (36.5 C)  SpO2: 98% 93%   Vitals:   08/17/20 0459 08/17/20 0735 08/17/20 0737 08/17/20 1219  BP: 124/79 (!) 107/59  (!) 118/42  Pulse: 74 66  74  Resp: 17 18  18   Temp: 98.4 F (36.9 C) 98 F (36.7 C)  97.7 F (36.5 C)  TempSrc: Oral Oral  Oral  SpO2: 100% 100% 98% 93%  Weight:      Height:        General: Pt is alert, awake, not in acute distress Cardiovascular: RRR, S1/S2 +, no rubs, no gallops Respiratory: CTA bilaterally, no wheezing, no rhonchi Abdominal: Soft, NT, ND, bowel sounds + Extremities: using polar cube, no peripheral edema, no cyanosis    The results of significant diagnostics from this hospitalization (including imaging, microbiology, ancillary and laboratory) are listed below for reference.     Microbiology: Recent Results (from the past 240 hour(s))  SARS CORONAVIRUS 2 (TAT 6-24 HRS) Nasopharyngeal Nasopharyngeal Swab     Status: None   Collection Time: 08/08/20  5:27 PM   Specimen: Nasopharyngeal Swab  Result Value Ref Range Status   SARS Coronavirus 2 NEGATIVE NEGATIVE Final    Comment: (NOTE) SARS-CoV-2 target nucleic acids are NOT DETECTED.  The SARS-CoV-2 RNA is generally detectable in upper and lower respiratory specimens during the acute phase of infection. Negative results do not preclude SARS-CoV-2 infection, do not rule out co-infections with other pathogens, and should not be used as the sole basis for treatment or other patient management decisions. Negative results must be combined with clinical observations, patient history, and epidemiological information. The expected result is Negative.  Fact Sheet  for Patients: SugarRoll.be  Fact Sheet for Healthcare Providers: https://www.woods-mathews.com/  This test is not yet approved or cleared by the [place] FDA and  has been authorized for detection and/or diagnosis of SARS-CoV-2 by FDA under an Emergency Use Authorization (EUA). This EUA will remain  in effect (meaning this test can be used) for the duration of the COVID-19 declaration under Se ction 564(b)(1) of the Act, 21 U.S.C. section 360bbb-3(b)(1), unless the authorization is terminated or revoked sooner.  Performed at [facility], [place] [address], [place], [place] [address]   Urine Culture     Status: Abnormal   Collection Time: 08/14/20  3:19 PM   Specimen: Urine, Random  Result Value Ref Range Status   Specimen Description   Final    URINE, RANDOM Performed at [facility], [address] [place], [place] [address]    Special Requests   Final    NONE Performed at [facility], [address] [place], [place] [address]    Culture >=100,000 COLONIES/mL ESCHERICHIA COLI (A)  Final   Report Status 08/17/2020 FINAL  Final   Organism ID, Bacteria ESCHERICHIA COLI (A)  Final      Susceptibility   Escherichia coli - MIC*    AMPICILLIN 8 SENSITIVE Sensitive     CEFAZOLIN <=4 SENSITIVE Sensitive     CEFEPIME <=0.12 SENSITIVE Sensitive     CEFTRIAXONE <=0.25 SENSITIVE Sensitive     CIPROFLOXACIN <=0.25 SENSITIVE Sensitive     GENTAMICIN 2 SENSITIVE Sensitive     IMIPENEM <=0.25 SENSITIVE Sensitive     NITROFURANTOIN <=16 SENSITIVE Sensitive     TRIMETH/SULFA <=20 SENSITIVE Sensitive     AMPICILLIN/SULBACTAM <=2 SENSITIVE Sensitive     PIP/TAZO <=4  SENSITIVE Sensitive     * >=100,000 COLONIES/mL ESCHERICHIA COLI     Labs: BNP (last 3 results) No results for input(s): BNP in the last 8760 hours. Basic Metabolic Panel: Recent Labs  Lab 08/14/20 1639 08/15/20 0555 08/15/20 1358 08/16/20 0514  08/16/20 1535 08/17/20 0614  NA 120* 126* 124* 128* 128* 127*  K 4.7 4.5  --  4.0  --  4.8  CL 85* 92*  --  94*  --  94*  CO2 25 27  --  27  --  27  GLUCOSE 108* 110*  --  96  --  110*  BUN 10 8  --  8  --  7*  CREATININE 0.72 0.76  --  0.67  --  0.55  CALCIUM 8.6* 9.1  --  8.9  --  8.8*   Liver Function Tests: Recent Labs  Lab 08/14/20 1639  AST 32  ALT 23  ALKPHOS 83  BILITOT 1.3*  PROT 6.9  ALBUMIN 3.2*   No results for input(s): LIPASE, AMYLASE in the last 168 hours. No results for input(s): AMMONIA in the last 168 hours. CBC: Recent Labs  Lab 08/14/20 1519 08/15/20 0555 08/16/20 0514 08/17/20 0614  WBC 7.9 7.4 5.9 7.4  NEUTROABS 5.7  --   --   --   HGB 9.4* 9.3* 8.6* 8.2*  HCT 28.5* 28.4* 26.7* 25.2*  MCV 83.6 83.0 85.3 85.7  PLT 501* 463* 437* 445*   Cardiac Enzymes: No results for input(s): CKTOTAL, CKMB, CKMBINDEX, TROPONINI in the last 168 hours. BNP: Invalid input(s): POCBNP CBG: No results for input(s): GLUCAP in the last 168 hours. D-Dimer Recent Labs    08/14/20 1639  DDIMER 3.27*   Hgb A1c No results for input(s): HGBA1C in the last 72 hours. Lipid Profile No results for input(s): CHOL, HDL, LDLCALC, TRIG, CHOLHDL, LDLDIRECT in the last 72 hours. Thyroid function studies Recent Labs    08/16/20 0514  TSH 4.674*   Anemia work up No results for input(s): VITAMINB12, FOLATE, FERRITIN, TIBC, IRON, RETICCTPCT in the last 72 hours. Urinalysis    Component Value Date/Time   COLORURINE YELLOW (A) 08/14/2020 1519   APPEARANCEUR HAZY (A) 08/14/2020 1519   APPEARANCEUR Cloudy (A) 03/10/2019 0804   LABSPEC 1.008 08/14/2020 1519   LABSPEC 1.024 08/07/2013 2020   PHURINE 8.0 08/14/2020 1519   GLUCOSEU NEGATIVE 08/14/2020 1519   GLUCOSEU Negative 08/07/2013 2020   HGBUR MODERATE (A) 08/14/2020 1519   BILIRUBINUR NEGATIVE 08/14/2020 1519   BILIRUBINUR Negative 03/10/2019 0804   BILIRUBINUR Negative 08/07/2013 2020   KETONESUR NEGATIVE  08/14/2020 1519   PROTEINUR NEGATIVE 08/14/2020 1519   NITRITE POSITIVE (A) 08/14/2020 1519   LEUKOCYTESUR TRACE (A) 08/14/2020 1519   LEUKOCYTESUR Negative 08/07/2013 2020   Sepsis Labs Invalid input(s): PROCALCITONIN,  WBC,  LACTICIDVEN Microbiology Recent Results (from the past 240 hour(s))  SARS CORONAVIRUS 2 (TAT 6-24 HRS) Nasopharyngeal Nasopharyngeal Swab     Status: None   Collection Time: 08/08/20  5:27 PM   Specimen: Nasopharyngeal Swab  Result Value Ref Range Status   SARS Coronavirus 2 NEGATIVE NEGATIVE Final    Comment: (NOTE) SARS-CoV-2 target nucleic acids are NOT DETECTED.  The SARS-CoV-2 RNA is generally detectable in upper and lower respiratory specimens during the acute phase of infection. Negative results do not preclude SARS-CoV-2 infection, do not rule out co-infections with other pathogens, and should not be used as the sole basis for treatment or other patient management decisions. Negative results must be  combined with clinical observations, patient history, and epidemiological information. The expected result is Negative.  Fact Sheet for Patients: SugarRoll.be  Fact Sheet for Healthcare Providers: [email or web address]  This test is not yet approved or cleared by the [place] FDA and  has been authorized for detection and/or diagnosis of SARS-CoV-2 by FDA under an Emergency Use Authorization (EUA). This EUA will remain  in effect (meaning this test can be used) for the duration of the COVID-19 declaration under Se ction 564(b)(1) of the Act, 21 U.S.C. section 360bbb-3(b)(1), unless the authorization is terminated or revoked sooner.  Performed at [facility], [place] [address] [place], [place] [address]   Urine Culture     Status: Abnormal   Collection Time: 08/14/20  3:19 PM   Specimen: Urine, Random  Result Value Ref Range Status   Specimen Description   Final    URINE,  RANDOM Performed at [facility], [address] [place], [place] [address]    Special Requests   Final    NONE Performed at [facility], [address] [place], [place] [address]    Culture >=100,000 COLONIES/mL ESCHERICHIA COLI (A)  Final   Report Status 08/17/2020 FINAL  Final   Organism ID, Bacteria ESCHERICHIA COLI (A)  Final      Susceptibility   Escherichia coli - MIC*    AMPICILLIN 8 SENSITIVE Sensitive     CEFAZOLIN <=4 SENSITIVE Sensitive     CEFEPIME <=0.12 SENSITIVE Sensitive     CEFTRIAXONE <=0.25 SENSITIVE Sensitive     CIPROFLOXACIN <=0.25 SENSITIVE Sensitive     GENTAMICIN 2 SENSITIVE Sensitive     IMIPENEM <=0.25 SENSITIVE Sensitive     NITROFURANTOIN <=16 SENSITIVE Sensitive     TRIMETH/SULFA <=20 SENSITIVE Sensitive     AMPICILLIN/SULBACTAM <=2 SENSITIVE Sensitive     PIP/TAZO <=4 SENSITIVE Sensitive     * >=100,000 COLONIES/mL ESCHERICHIA COLI     Time coordinating discharge: Over 30 minutes  SIGNED:   Ezekiel Slocumb, DO [facility] 08/17/2020, 12:49 PM   If 7PM-7AM, please contact night-coverage [email or web address]

## 2020-11-11 ENCOUNTER — Ambulatory Visit: Payer: Self-pay

## 2020-11-11 NOTE — Telephone Encounter (Signed)
Reason for Disposition . [1] Mild-moderate nosebleed AND [2] bleeding stopped now  Answer Assessment - Initial Assessment Questions 1. AMOUNT OF BLEEDING: "How bad is the bleeding?" "How much blood was lost?" "Has the bleeding stopped?"   - MILD: needed a couple tissues   - MODERATE: needed many tissues   - SEVERE: large blood clots, soaked many tissues, lasted more than 30 minutes      MILD was heavy earlier but has stopped. 2. ONSET: "When did the nosebleed start?"      This morning 3. FREQUENCY: "How many nosebleeds have you had in the last 24 hours?"      Had earlier today, has stopped at this time 4. RECURRENT SYMPTOMS: "Have there been other recent nosebleeds?" If Yes, ask: "How long did it take you to stop the bleeding?" "What worked best?"      no 5. CAUSE: "What do you think caused this nosebleed?"     Family has COVID, no symptoms for this pt 6. LOCAL FACTORS: "Do you have any cold symptoms?", "Have you been rubbing or picking at your nose?"     No but is on oxygen at all times 7. SYSTEMIC FACTORS: "Do you have high blood pressure or any bleeding problems?"     no 8. BLOOD THINNERS: "Do you take any blood thinners?" (e.g., aspirin, clopidogrel / Plavix, coumadin, heparin). Notes: Other strong blood thinners include: Arixtra (fondaparinux), Eliquis (apixaban), Pradaxa (dabigatran), and Xarelto (rivaroxaban).     yes 9. OTHER SYMPTOMS: "Do you have any other symptoms?" (e.g., lightheadedness)     no 10. PREGNANCY: "Is there any chance you are pregnant?" "When was your last menstrual period?"       no  Protocols used: Nosebleed-A-AH

## 2020-11-11 NOTE — Telephone Encounter (Signed)
Patient called, left VM to return the call to speak to a Triage Nurse [phone].   Summary: Advice   Pt called into the community line stating all her family members have tested positive for covid within the last few days. Pt she is worried and stated her nose has been bleeding so she is not able to wear her oxygen mask properly and wanted to know if she should go to the ER.

## 2021-02-10 ENCOUNTER — Other Ambulatory Visit: Payer: Self-pay | Admitting: Family Medicine

## 2021-02-10 DIAGNOSIS — Z1231 Encounter for screening mammogram for malignant neoplasm of breast: Secondary | ICD-10-CM

## 2021-05-24 ENCOUNTER — Inpatient Hospital Stay: Admission: RE | Admit: 2021-05-24 | Payer: Medicare HMO | Source: Ambulatory Visit

## 2021-08-31 ENCOUNTER — Other Ambulatory Visit: Payer: Self-pay | Admitting: Family Medicine

## 2021-08-31 DIAGNOSIS — Z1231 Encounter for screening mammogram for malignant neoplasm of breast: Secondary | ICD-10-CM

## 2021-09-29 ENCOUNTER — Other Ambulatory Visit: Payer: Self-pay | Admitting: Student

## 2021-09-29 DIAGNOSIS — M1712 Unilateral primary osteoarthritis, left knee: Secondary | ICD-10-CM

## 2021-09-29 DIAGNOSIS — M84362A Stress fracture, left tibia, initial encounter for fracture: Secondary | ICD-10-CM

## 2021-09-29 DIAGNOSIS — M25462 Effusion, left knee: Secondary | ICD-10-CM

## 2021-09-29 DIAGNOSIS — Z96652 Presence of left artificial knee joint: Secondary | ICD-10-CM

## 2021-10-04 ENCOUNTER — Ambulatory Visit
Admission: RE | Admit: 2021-10-04 | Discharge: 2021-10-04 | Disposition: A | Discharge status: Home / Self Care | Payer: Medicare HMO | Source: Ambulatory Visit | Attending: Student | Admitting: Student

## 2021-10-04 DIAGNOSIS — M1712 Unilateral primary osteoarthritis, left knee: Secondary | ICD-10-CM | POA: Insufficient documentation

## 2021-10-04 DIAGNOSIS — Z96652 Presence of left artificial knee joint: Secondary | ICD-10-CM | POA: Insufficient documentation

## 2021-10-04 DIAGNOSIS — M84362A Stress fracture, left tibia, initial encounter for fracture: Secondary | ICD-10-CM | POA: Diagnosis present

## 2021-10-04 DIAGNOSIS — M25462 Effusion, left knee: Secondary | ICD-10-CM | POA: Diagnosis present

## 2021-10-11 ENCOUNTER — Ambulatory Visit: Payer: Medicare HMO

## 2022-06-17 IMAGING — CT CT KNEE*L* W/O CM
1 of 3 series · 6 of 14 positions shown, 8 images · non-contrast
Comparison: Radiographs dated August 03, 2020

CLINICAL DATA: Status post total knee arthroplasty. Fall
09/09/2021. Pain and bruising of the left.

EXAM:
CT OF THE LEFT KNEE WITHOUT CONTRAST
TECHNIQUE: Multidetector CT imaging of the left knee was performed according to
the standard protocol. Multiplanar CT image reconstructions were
also generated.
RADIATION DOSE REDUCTION: This exam was performed according to the
departmental dose-optimization program which includes automated
exposure control, adjustment of the mA and/or kV according to
patient size and/or use of iterative reconstruction technique.

[Series 3: thin bone · axial · 0.34mm/px · z∈[+376,+515]mm · 6 of 651 slices shown, 8 images]
[im 93/651  soft-tissue]
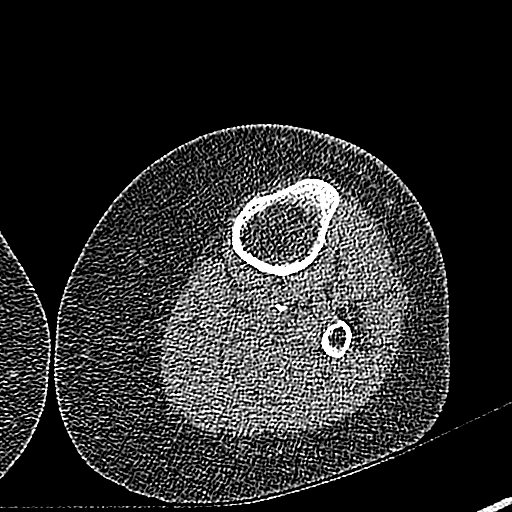
[im 93/651  bone]
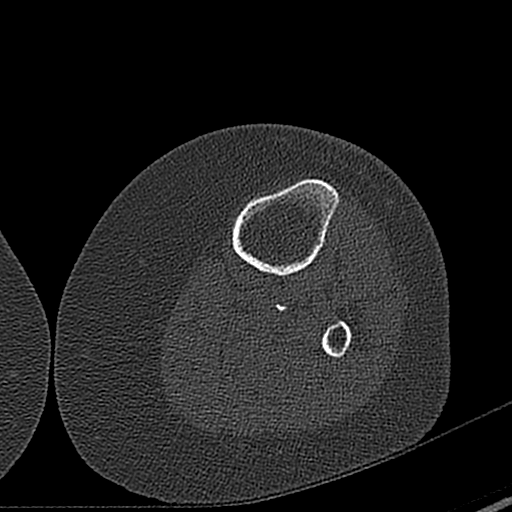
[im 186/651  bone]
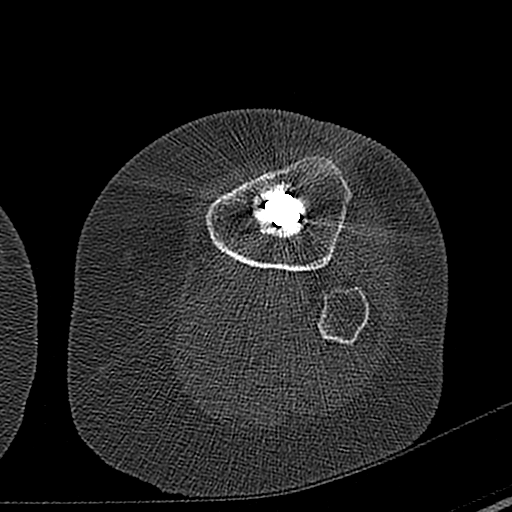
[im 279/651  bone]
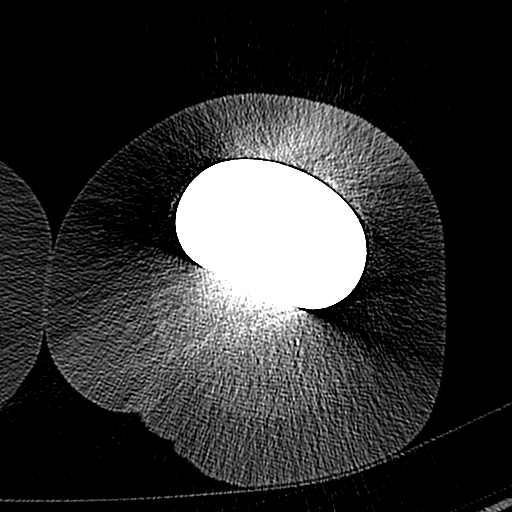
[im 372/651  bone]
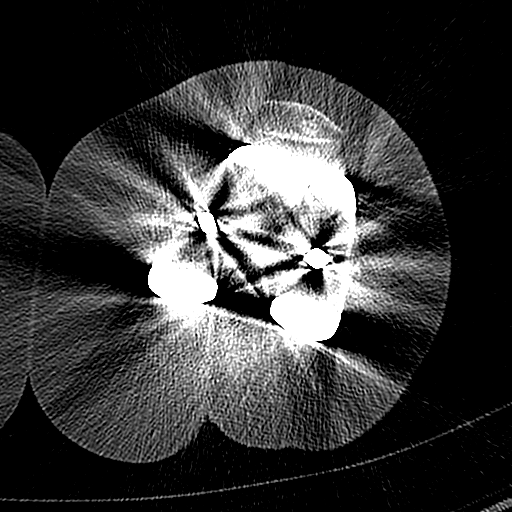
[im 465/651  soft-tissue]
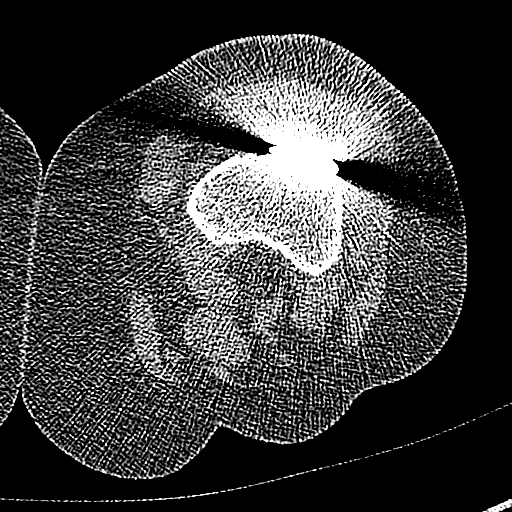
[im 465/651  bone]
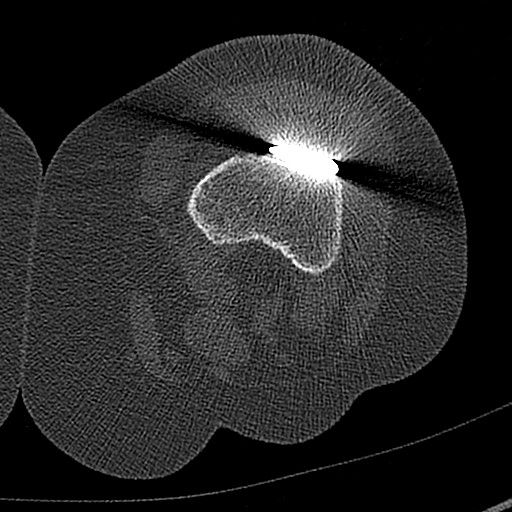
[im 558/651  bone]
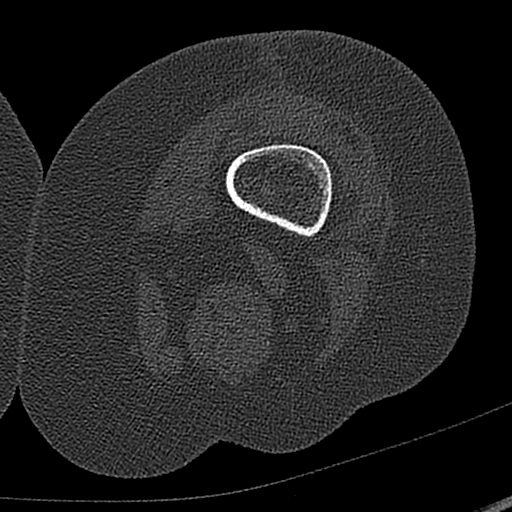

[6 of 14 positions shown; findings below may reference images not displayed]

FINDINGS: Bones/Joint/Cartilage

Status post left knee total arthroplasty. No perihardware loosening
or evidence of fracture. Small suprapatellar joint effusion.

Ligaments

Suboptimally assessed by CT.

Muscles and Tendons

Muscles are normal in bulk and density. No intramuscular collection
or abscess.

Soft tissues

Subcutaneous soft tissues are within normal limits.
IMPRESSION: 1. Status post left knee arthroplasty. The hardware is intact
without evidence of loosening or periprosthetic fracture.

2.  Small suprapatellar joint effusion.

## 2022-12-06 ENCOUNTER — Other Ambulatory Visit: Payer: Self-pay | Admitting: Family Medicine

## 2022-12-06 DIAGNOSIS — Z1231 Encounter for screening mammogram for malignant neoplasm of breast: Secondary | ICD-10-CM

## 2022-12-13 ENCOUNTER — Inpatient Hospital Stay
Admission: RE | Admit: 2022-12-13 | Discharge: 2022-12-13 | Disposition: A | Discharge status: Home / Self Care | Payer: Self-pay | Source: Ambulatory Visit | Attending: Family Medicine | Admitting: Family Medicine

## 2022-12-13 ENCOUNTER — Other Ambulatory Visit: Payer: Self-pay | Admitting: *Deleted

## 2022-12-13 DIAGNOSIS — Z1231 Encounter for screening mammogram for malignant neoplasm of breast: Secondary | ICD-10-CM

## 2022-12-14 ENCOUNTER — Inpatient Hospital Stay: Admission: RE | Admit: 2022-12-14 | Payer: Medicare HMO | Source: Ambulatory Visit

## 2023-01-11 ENCOUNTER — Ambulatory Visit
Admission: RE | Admit: 2023-01-11 | Discharge: 2023-01-11 | Disposition: A | Discharge status: Home / Self Care | Payer: Medicare HMO | Source: Ambulatory Visit | Attending: Family Medicine | Admitting: Family Medicine

## 2023-01-11 DIAGNOSIS — Z1231 Encounter for screening mammogram for malignant neoplasm of breast: Secondary | ICD-10-CM | POA: Insufficient documentation

## 2023-01-24 ENCOUNTER — Inpatient Hospital Stay
Admission: EM | Admit: 2023-01-24 | Discharge: 2023-02-06 | DRG: 302 | Disposition: E | Payer: Medicare HMO | Attending: Internal Medicine | Admitting: Internal Medicine

## 2023-01-24 ENCOUNTER — Encounter: Payer: Self-pay | Admitting: Internal Medicine

## 2023-01-24 ENCOUNTER — Emergency Department: Payer: Medicare HMO

## 2023-01-24 DIAGNOSIS — R7401 Elevation of levels of liver transaminase levels: Secondary | ICD-10-CM | POA: Diagnosis present

## 2023-01-24 DIAGNOSIS — I462 Cardiac arrest due to underlying cardiac condition: Secondary | ICD-10-CM | POA: Diagnosis present

## 2023-01-24 DIAGNOSIS — I469 Cardiac arrest, cause unspecified: Secondary | ICD-10-CM

## 2023-01-24 DIAGNOSIS — J690 Pneumonitis due to inhalation of food and vomit: Secondary | ICD-10-CM | POA: Diagnosis present

## 2023-01-24 DIAGNOSIS — I77819 Aortic ectasia, unspecified site: Secondary | ICD-10-CM | POA: Diagnosis present

## 2023-01-24 DIAGNOSIS — Z7985 Long-term (current) use of injectable non-insulin antidiabetic drugs: Secondary | ICD-10-CM

## 2023-01-24 DIAGNOSIS — W07XXXA Fall from chair, initial encounter: Secondary | ICD-10-CM | POA: Diagnosis present

## 2023-01-24 DIAGNOSIS — Z66 Do not resuscitate: Secondary | ICD-10-CM | POA: Diagnosis present

## 2023-01-24 DIAGNOSIS — I2510 Atherosclerotic heart disease of native coronary artery without angina pectoris: Principal | ICD-10-CM | POA: Diagnosis present

## 2023-01-24 DIAGNOSIS — R578 Other shock: Secondary | ICD-10-CM | POA: Diagnosis present

## 2023-01-24 DIAGNOSIS — Z515 Encounter for palliative care: Principal | ICD-10-CM

## 2023-01-24 DIAGNOSIS — G9341 Metabolic encephalopathy: Secondary | ICD-10-CM | POA: Diagnosis present

## 2023-01-24 DIAGNOSIS — Z91041 Radiographic dye allergy status: Secondary | ICD-10-CM

## 2023-01-24 DIAGNOSIS — Z7902 Long term (current) use of antithrombotics/antiplatelets: Secondary | ICD-10-CM

## 2023-01-24 DIAGNOSIS — I5033 Acute on chronic diastolic (congestive) heart failure: Secondary | ICD-10-CM | POA: Diagnosis present

## 2023-01-24 DIAGNOSIS — G9340 Encephalopathy, unspecified: Secondary | ICD-10-CM | POA: Diagnosis present

## 2023-01-24 DIAGNOSIS — Z9981 Dependence on supplemental oxygen: Secondary | ICD-10-CM

## 2023-01-24 DIAGNOSIS — J441 Chronic obstructive pulmonary disease with (acute) exacerbation: Secondary | ICD-10-CM | POA: Diagnosis present

## 2023-01-24 DIAGNOSIS — I4901 Ventricular fibrillation: Secondary | ICD-10-CM | POA: Diagnosis present

## 2023-01-24 DIAGNOSIS — Z90710 Acquired absence of both cervix and uterus: Secondary | ICD-10-CM

## 2023-01-24 DIAGNOSIS — J9621 Acute and chronic respiratory failure with hypoxia: Secondary | ICD-10-CM | POA: Diagnosis present

## 2023-01-24 DIAGNOSIS — Z96652 Presence of left artificial knee joint: Secondary | ICD-10-CM | POA: Diagnosis present

## 2023-01-24 DIAGNOSIS — J439 Emphysema, unspecified: Secondary | ICD-10-CM | POA: Diagnosis present

## 2023-01-24 DIAGNOSIS — I10 Essential (primary) hypertension: Secondary | ICD-10-CM | POA: Diagnosis present

## 2023-01-24 DIAGNOSIS — Z7982 Long term (current) use of aspirin: Secondary | ICD-10-CM

## 2023-01-24 DIAGNOSIS — S7011XA Contusion of right thigh, initial encounter: Secondary | ICD-10-CM | POA: Diagnosis present

## 2023-01-24 DIAGNOSIS — Z8719 Personal history of other diseases of the digestive system: Secondary | ICD-10-CM

## 2023-01-24 DIAGNOSIS — I459 Conduction disorder, unspecified: Secondary | ICD-10-CM | POA: Diagnosis present

## 2023-01-24 DIAGNOSIS — Z803 Family history of malignant neoplasm of breast: Secondary | ICD-10-CM

## 2023-01-24 DIAGNOSIS — S51011A Laceration without foreign body of right elbow, initial encounter: Secondary | ICD-10-CM | POA: Diagnosis present

## 2023-01-24 DIAGNOSIS — I110 Hypertensive heart disease with heart failure: Secondary | ICD-10-CM | POA: Diagnosis present

## 2023-01-24 DIAGNOSIS — E871 Hypo-osmolality and hyponatremia: Secondary | ICD-10-CM | POA: Diagnosis present

## 2023-01-24 DIAGNOSIS — R7303 Prediabetes: Secondary | ICD-10-CM | POA: Diagnosis present

## 2023-01-24 DIAGNOSIS — I447 Left bundle-branch block, unspecified: Secondary | ICD-10-CM | POA: Diagnosis present

## 2023-01-24 DIAGNOSIS — E785 Hyperlipidemia, unspecified: Secondary | ICD-10-CM | POA: Diagnosis present

## 2023-01-24 DIAGNOSIS — Z880 Allergy status to penicillin: Secondary | ICD-10-CM

## 2023-01-24 DIAGNOSIS — R579 Shock, unspecified: Secondary | ICD-10-CM | POA: Insufficient documentation

## 2023-01-24 DIAGNOSIS — Z1152 Encounter for screening for COVID-19: Secondary | ICD-10-CM

## 2023-01-24 DIAGNOSIS — I739 Peripheral vascular disease, unspecified: Secondary | ICD-10-CM | POA: Diagnosis present

## 2023-01-24 DIAGNOSIS — R739 Hyperglycemia, unspecified: Secondary | ICD-10-CM | POA: Diagnosis present

## 2023-01-24 DIAGNOSIS — I472 Ventricular tachycardia, unspecified: Secondary | ICD-10-CM | POA: Diagnosis present

## 2023-01-24 DIAGNOSIS — E271 Primary adrenocortical insufficiency: Secondary | ICD-10-CM | POA: Diagnosis present

## 2023-01-24 DIAGNOSIS — R911 Solitary pulmonary nodule: Secondary | ICD-10-CM | POA: Diagnosis present

## 2023-01-24 DIAGNOSIS — J9622 Acute and chronic respiratory failure with hypercapnia: Secondary | ICD-10-CM | POA: Diagnosis present

## 2023-01-24 DIAGNOSIS — Z9049 Acquired absence of other specified parts of digestive tract: Secondary | ICD-10-CM

## 2023-01-24 DIAGNOSIS — J449 Chronic obstructive pulmonary disease, unspecified: Secondary | ICD-10-CM | POA: Diagnosis present

## 2023-01-24 DIAGNOSIS — Z79899 Other long term (current) drug therapy: Secondary | ICD-10-CM

## 2023-01-24 DIAGNOSIS — Z87891 Personal history of nicotine dependence: Secondary | ICD-10-CM | POA: Diagnosis not present

## 2023-01-24 DIAGNOSIS — Z87442 Personal history of urinary calculi: Secondary | ICD-10-CM

## 2023-01-24 DIAGNOSIS — S7012XA Contusion of left thigh, initial encounter: Secondary | ICD-10-CM | POA: Diagnosis present

## 2023-01-24 LAB — COMPREHENSIVE METABOLIC PANEL WITH GFR
ALT: 116 U/L — ABNORMAL HIGH (ref 0–44)
AST: 181 U/L — ABNORMAL HIGH (ref 15–41)
Albumin: 3.8 g/dL (ref 3.5–5.0)
Alkaline Phosphatase: 120 U/L (ref 38–126)
Anion gap: 12 (ref 5–15)
BUN: 9 mg/dL (ref 8–23)
CO2: 27 mmol/L (ref 22–32)
Calcium: 8.8 mg/dL — ABNORMAL LOW (ref 8.9–10.3)
Chloride: 93 mmol/L — ABNORMAL LOW (ref 98–111)
Creatinine, Ser: 1.01 mg/dL — ABNORMAL HIGH (ref 0.44–1.00)
GFR, Estimated: 59 mL/min — ABNORMAL LOW (ref >60)
Glucose, Bld: 206 mg/dL — ABNORMAL HIGH (ref 70–99)
Potassium: 3.7 mmol/L (ref 3.5–5.1)
Sodium: 132 mmol/L — ABNORMAL LOW (ref 135–145)
Total Bilirubin: 0.7 mg/dL (ref 0.3–1.2)
Total Protein: 7 g/dL (ref 6.5–8.1)

## 2023-01-24 LAB — CBG MONITORING, ED: Glucose-Capillary: 195 mg/dL — ABNORMAL HIGH (ref 70–99)

## 2023-01-24 LAB — BLOOD GAS, ARTERIAL
Acid-Base Excess: 4.3 mmol/L — ABNORMAL HIGH (ref 0.0–2.0)
Bicarbonate: 33.4 mmol/L — ABNORMAL HIGH (ref 20.0–28.0)
FIO2: 50 %
MECHVT: 470 mL
Mechanical Rate: 18
O2 Saturation: 97.4 %
PEEP: 5 cmH2O
Patient temperature: 37
pCO2 arterial: 71 mmHg (ref 32–48)
pH, Arterial: 7.28 — ABNORMAL LOW (ref 7.35–7.45)
pO2, Arterial: 80 mmHg — ABNORMAL LOW (ref 83–108)

## 2023-01-24 LAB — CBC WITH DIFFERENTIAL/PLATELET
Abs Immature Granulocytes: 3.62 10*3/uL — ABNORMAL HIGH (ref 0.00–0.07)
Basophils Absolute: 0.1 10*3/uL (ref 0.0–0.1)
Basophils Relative: 0 %
Eosinophils Absolute: 0.2 10*3/uL (ref 0.0–0.5)
Eosinophils Relative: 1 %
HCT: 35.2 % — ABNORMAL LOW (ref 36.0–46.0)
Hemoglobin: 11.2 g/dL — ABNORMAL LOW (ref 12.0–15.0)
Immature Granulocytes: 9 %
Lymphocytes Relative: 10 %
Lymphs Abs: 4 10*3/uL (ref 0.7–4.0)
MCH: 27.9 pg (ref 26.0–34.0)
MCHC: 31.8 g/dL (ref 30.0–36.0)
MCV: 87.8 fL (ref 80.0–100.0)
Monocytes Absolute: 1.6 10*3/uL — ABNORMAL HIGH (ref 0.1–1.0)
Monocytes Relative: 4 %
Neutro Abs: 30.6 10*3/uL — ABNORMAL HIGH (ref 1.7–7.7)
Neutrophils Relative %: 76 %
Platelets: 332 10*3/uL (ref 150–400)
RBC: 4.01 MIL/uL (ref 3.87–5.11)
RDW: 14.1 % (ref 11.5–15.5)
WBC: 40.2 10*3/uL — ABNORMAL HIGH (ref 4.0–10.5)
nRBC: 0 % (ref 0.0–0.2)

## 2023-01-24 LAB — LIPID PANEL
Cholesterol: 121 mg/dL (ref 0–200)
HDL: 65 mg/dL (ref >40)
LDL Cholesterol: 46 mg/dL (ref 0–99)
Total CHOL/HDL Ratio: 1.9 ratio
Triglycerides: 52 mg/dL (ref <150)
VLDL: 10 mg/dL (ref 0–40)

## 2023-01-24 LAB — APTT: aPTT: 34 s (ref 24–36)

## 2023-01-24 LAB — SARS CORONAVIRUS 2 BY RT PCR: SARS Coronavirus 2 by RT PCR: NEGATIVE

## 2023-01-24 LAB — PROTIME-INR
INR: 1.1 (ref 0.8–1.2)
Prothrombin Time: 14.2 s (ref 11.4–15.2)

## 2023-01-24 LAB — TROPONIN I (HIGH SENSITIVITY): Troponin I (High Sensitivity): 135 ng/L (ref <18)

## 2023-01-24 MED ORDER — ACETAMINOPHEN 325 MG PO TABS: 650.0000 mg | ORAL_TABLET | Freq: Four times a day (QID) | ORAL | Status: DC | PRN

## 2023-01-24 MED ORDER — SUCCINYLCHOLINE CHLORIDE 200 MG/10ML IV SOSY
100.0000 mg | PREFILLED_SYRINGE | Freq: Once | INTRAVENOUS | Status: AC
  Administered 2023-01-24: 100 mg via INTRAVENOUS
  Filled 2023-01-24: qty 10

## 2023-01-24 MED ORDER — ORAL CARE MOUTH RINSE
15.0000 mL | OROMUCOSAL | Status: DC
  Filled 2023-01-24 (×5): qty 15

## 2023-01-24 MED ORDER — FUROSEMIDE 10 MG/ML IJ SOLN
80.0000 mg | Freq: Once | INTRAMUSCULAR | Status: AC
  Administered 2023-01-24: 80 mg via INTRAVENOUS
  Filled 2023-01-24: qty 8

## 2023-01-24 MED ORDER — DOCUSATE SODIUM 50 MG/5ML PO LIQD: 100.0000 mg | Freq: Two times a day (BID) | ORAL | Status: DC

## 2023-01-24 MED ORDER — MORPHINE 100MG IN NS 100ML (1MG/ML) PREMIX INFUSION
0.0000 mg/h | INTRAVENOUS | Status: DC
  Administered 2023-01-24: 5 mg/h via INTRAVENOUS
  Administered 2023-01-25: 20 mg/h via INTRAVENOUS
  Filled 2023-01-24 (×3): qty 100

## 2023-01-24 MED ORDER — SODIUM BICARBONATE 8.4 % IV SOLN: 50.0000 meq | Freq: Once | INTRAVENOUS | Status: DC

## 2023-01-24 MED ORDER — SODIUM CHLORIDE 0.9 % IV SOLN: INTRAVENOUS | Status: DC

## 2023-01-24 MED ORDER — LORAZEPAM 2 MG/ML IJ SOLN: 2.0000 mg | INTRAMUSCULAR | Status: DC | PRN

## 2023-01-24 MED ORDER — CALCIUM GLUCONATE-NACL 1-0.675 GM/50ML-% IV SOLN: 1.0000 g | Freq: Once | INTRAVENOUS | Status: DC

## 2023-01-24 MED ORDER — NOREPINEPHRINE 4 MG/250ML-% IV SOLN
2.0000 ug/min | INTRAVENOUS | Status: DC
  Administered 2023-01-24: 2 ug/min via INTRAVENOUS
  Filled 2023-01-24: qty 250

## 2023-01-24 MED ORDER — MORPHINE BOLUS VIA INFUSION
5.0000 mg | INTRAVENOUS | Status: DC | PRN
  Administered 2023-01-24 (×2): 5 mg via INTRAVENOUS
  Administered 2023-01-25: 2 mg via INTRAVENOUS
  Administered 2023-01-25 (×10): 5 mg via INTRAVENOUS

## 2023-01-24 MED ORDER — GLYCOPYRROLATE 0.2 MG/ML IJ SOLN
0.2000 mg | INTRAMUSCULAR | Status: DC | PRN
  Filled 2023-01-24: qty 1

## 2023-01-24 MED ORDER — ORAL CARE MOUTH RINSE: 15.0000 mL | OROMUCOSAL | Status: DC | PRN

## 2023-01-24 MED ORDER — GLYCOPYRROLATE 1 MG PO TABS: 1.0000 mg | ORAL_TABLET | ORAL | Status: DC | PRN

## 2023-01-24 MED ORDER — POLYETHYLENE GLYCOL 3350 17 G PO PACK: 17.0000 g | PACK | Freq: Every day | ORAL | Status: DC | PRN

## 2023-01-24 MED ORDER — ETOMIDATE 2 MG/ML IV SOLN
20.0000 mg | Freq: Once | INTRAVENOUS | Status: AC
  Administered 2023-01-24: 20 mg via INTRAVENOUS
  Filled 2023-01-24: qty 10

## 2023-01-24 MED ORDER — PROPOFOL 1000 MG/100ML IV EMUL: 5.0000 ug/kg/min | INTRAVENOUS | Status: DC

## 2023-01-24 MED ORDER — PROPOFOL 1000 MG/100ML IV EMUL
INTRAVENOUS | Status: AC
  Administered 2023-01-24: 20 ug/kg/min via INTRAVENOUS
  Filled 2023-01-24: qty 100

## 2023-01-24 MED ORDER — DOCUSATE SODIUM 50 MG/5ML PO LIQD: 100.0000 mg | Freq: Two times a day (BID) | ORAL | Status: DC | PRN

## 2023-01-24 MED ORDER — POLYVINYL ALCOHOL 1.4 % OP SOLN: 1.0000 [drp] | Freq: Four times a day (QID) | OPHTHALMIC | Status: DC | PRN

## 2023-01-24 MED ORDER — GLYCOPYRROLATE 0.2 MG/ML IJ SOLN: 0.2000 mg | INTRAMUSCULAR | Status: DC | PRN

## 2023-01-24 MED ORDER — FAMOTIDINE 20 MG PO TABS: 20.0000 mg | ORAL_TABLET | Freq: Two times a day (BID) | ORAL | Status: DC

## 2023-01-24 MED ORDER — ACETAMINOPHEN 325 MG RE SUPP: 650.0000 mg | Freq: Four times a day (QID) | RECTAL | Status: DC | PRN

## 2023-01-24 MED ORDER — MORPHINE SULFATE (PF) 2 MG/ML IV SOLN: 2.0000 mg | INTRAVENOUS | Status: DC | PRN

## 2023-01-24 MED ORDER — FENTANYL 2500MCG IN NS 250ML (10MCG/ML) PREMIX INFUSION
INTRAVENOUS | Status: AC
  Administered 2023-01-24: 75 ug/h via INTRAVENOUS
  Filled 2023-01-24: qty 250

## 2023-01-24 MED ORDER — LORAZEPAM 2 MG/ML IJ SOLN
INTRAMUSCULAR | Status: AC
  Administered 2023-01-24: 4 mg via INTRAVENOUS
  Filled 2023-01-24: qty 2

## 2023-01-24 MED ORDER — MIDAZOLAM HCL 2 MG/2ML IJ SOLN
1.0000 mg | INTRAMUSCULAR | Status: DC | PRN
  Administered 2023-01-24: 1 mg via INTRAVENOUS
  Filled 2023-01-24: qty 2

## 2023-01-24 MED ORDER — MORPHINE SULFATE (PF) 2 MG/ML IV SOLN
INTRAVENOUS | Status: AC
  Administered 2023-01-24: 4 mg via INTRAVENOUS
  Filled 2023-01-24: qty 2

## 2023-01-24 MED ORDER — POLYETHYLENE GLYCOL 3350 17 G PO PACK: 17.0000 g | PACK | Freq: Every day | ORAL | Status: DC

## 2023-01-24 MED ORDER — CALCIUM GLUCONATE-NACL 1-0.675 GM/50ML-% IV SOLN
INTRAVENOUS | Status: AC
  Filled 2023-01-24: qty 50

## 2023-01-24 MED ORDER — METHYLPREDNISOLONE SODIUM SUCC 40 MG IJ SOLR
40.0000 mg | Freq: Once | INTRAMUSCULAR | Status: AC
  Administered 2023-01-24: 40 mg via INTRAVENOUS
  Filled 2023-01-24: qty 1

## 2023-01-24 MED ORDER — MIDAZOLAM HCL 2 MG/2ML IJ SOLN: 2.0000 mg | INTRAMUSCULAR | Status: DC | PRN

## 2023-01-24 MED ORDER — SODIUM CHLORIDE 0.9 % IV SOLN: 250.0000 mL | INTRAVENOUS | Status: DC

## 2023-01-24 MED ORDER — DEXMEDETOMIDINE HCL IN NACL 400 MCG/100ML IV SOLN
0.0000 ug/kg/h | INTRAVENOUS | Status: DC
  Administered 2023-01-24: 0.8 ug/kg/h via INTRAVENOUS
  Administered 2023-01-25: 1.2 ug/kg/h via INTRAVENOUS
  Filled 2023-01-24 (×2): qty 100

## 2023-01-24 MED ORDER — MIDAZOLAM HCL 2 MG/2ML IJ SOLN
INTRAMUSCULAR | Status: AC
  Filled 2023-01-24: qty 2

## 2023-01-24 MED ORDER — FENTANYL 2500MCG IN NS 250ML (10MCG/ML) PREMIX INFUSION: 0.0000 ug/h | INTRAVENOUS | Status: DC

## 2023-01-24 MED ORDER — DIPHENHYDRAMINE HCL 50 MG/ML IJ SOLN: 50.0000 mg | Freq: Once | INTRAMUSCULAR | Status: DC

## 2023-01-24 MED ORDER — CALCIUM CHLORIDE 10 % IV SOLN
INTRAVENOUS | Status: AC
  Filled 2023-01-24: qty 10

## 2023-01-24 MED ORDER — DIPHENHYDRAMINE HCL 25 MG PO CAPS: 50.0000 mg | ORAL_CAPSULE | Freq: Once | ORAL | Status: DC

## 2023-01-24 MED ORDER — SODIUM BICARBONATE 8.4 % IV SOLN
INTRAVENOUS | Status: AC
  Filled 2023-01-24: qty 50
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
## 2023-01-24 NOTE — ED Triage Notes (Signed)
Pt arrived via [facility] from home where pt had witnessed arrest while sitting in chair and fell onto wooden floor. [employer] down time <30 minutes with report of family beginning CPR on scene prior to 1st-responder arrival. Per EMS, pt initially in V-fib, shocked x3, given 4mg  Narcan, 2 Epi, 300 IV Amio push. Pt remained in V-tach in route with radial pulses present. EMS administered 3mg  Versed in route due to pt biting I-gel tube.   EDP at bedside on arrival who request EKG with need for on-call STEMI cardiologist.

## 2023-01-24 NOTE — IPAL (Incomplete)
Interdisciplinary Goals of Care Family Meeting   Date carried out: 02/22/2023  Location of the meeting: {IDGC Location:21390}  Member's involved: {IDGC Participants:[id number]::"Physician","Bedside Registered Nurse","Social Worker","Family Member or next of kin"}  Durable Power of Insurance risk surveyor: ***    Discussion: We discussed goals of care for Kimberly Woodward .  ***  Code status:   Code Status: Limited: Do not attempt resuscitation (DNR) -DNR-LIMITED -Do Not Intubate/DNI    Disposition: {IDGC Disposition:21372}  Time spent for the meeting: ***    Cecelia Byars Rust-Chester, NP  22-Feb-2023, 11:33 PM

## 2023-01-24 NOTE — ED Notes (Addendum)
Pts daughter and husband in room with Rust-Chester, NP.

## 2023-01-24 NOTE — ED Provider Notes (Signed)
[facility] Provider Note    Event Date/Time   First MD Initiated Contact with Patient 02/03/2023 2203     (approximate)   History   Cardiac Arrest   HPI  Kimberly Woodward is a 72 y.o. female      Presents via EMS via emergency traffic for cardiac arrest.  EMS reports patient with family with witnessed arrest.  Family member called 911 and started CPR, upon EMS arrival patient pulseless and apneic found to be in V-fib, ACLS started, 3 shocks administered and converted to wide-complex tachycardia with pulses.  A total of 3 rounds of epinephrine were given as well.  Also 300 of amiodarone.  Arrives in the emergency department with agonal breathing, increasingly agitated and moving upper extremities, tachycardic with slightly wide-complex.  -- I spoke with daughter after initial resuscitation, who states that her sister witnessed the arrest.  The patient was in her regular state of health, with no new or acute medical complaints during the day and was playing with granddaughter who climbed up onto her lap and upon that time the patient syncopized, went stiff and unresponsive.  At that time the sister called EMS and started CPR.  Independent Historian contributed to assessment above: EMS gives report as above I also spoke with family members to provide collateral information     Physical Exam   Triage Vital Signs: ED Triage Vitals [02/05/2023 2200]  Encounter Vitals Group     BP      Systolic BP Percentile      Diastolic BP Percentile      Pulse      Resp      Temp      Temp src      SpO2      Weight 185 lb 3 oz (84 kg)     Height 5' 2.6" (1.59 m)     Head Circumference      Peak Flow      Pain Score      Pain Loc      Pain Education      Exclude from Growth Chart     Most recent vital signs: Vitals:   01/13/2023 2230 01/11/2023 2245  BP: 128/72   Pulse: (!) 115 (!) 128  Resp: (!) 23 (!) 24  SpO2: 96% 96%    General: Tachycardic, agonal  respirations, moving upper extremities.  She has rales to auscultation in all lung fields.  She has roving eye movements.  She does not follow commands.  Her abdomen is nondistended, soft.  There is a skin tear to the right elbow.  There are some bruising to the both anterior thighs.   ED Results / Procedures / Treatments   Labs (all labs ordered are listed, but only abnormal results are displayed) Labs Reviewed  CBC WITH DIFFERENTIAL/PLATELET - Abnormal; Notable for the following components:      Result Value   WBC 40.2 (*)    Hemoglobin 11.2 (*)    HCT 35.2 (*)    All other components within normal limits  COMPREHENSIVE METABOLIC PANEL - Abnormal; Notable for the following components:   Sodium 132 (*)    Chloride 93 (*)    Glucose, Bld 206 (*)    Creatinine, Ser 1.01 (*)    Calcium 8.8 (*)    AST 181 (*)    ALT 116 (*)    GFR, Estimated 59 (*)    All other components within normal limits  BLOOD GAS,  ARTERIAL - Abnormal; Notable for the following components:   pH, Arterial 7.28 (*)    pCO2 arterial 71 (*)    pO2, Arterial 80 (*)    Bicarbonate 33.4 (*)    Acid-Base Excess 4.3 (*)    All other components within normal limits  CBG MONITORING, ED - Abnormal; Notable for the following components:   Glucose-Capillary 195 (*)    All other components within normal limits  TROPONIN I (HIGH SENSITIVITY) - Abnormal; Notable for the following components:   Troponin I (High Sensitivity) 135 (*)    All other components within normal limits  SARS CORONAVIRUS 2 BY RT PCR  PROTIME-INR  APTT  LIPID PANEL  CBC  MAGNESIUM  PHOSPHORUS  TRIGLYCERIDES  URINE DRUG SCREEN, QUALITATIVE ([facility] ONLY)     I ordered and reviewed the above labs they are notable for white blood cell count is markedly elevated at 40, and her glucose is in the 100s.  EKG  ED ECG REPORT I, Pilar Jarvis, the attending physician, personally viewed and interpreted this ECG.   Date: 2023-02-23  EKG Time: 2155   Rate: 127  Rhythm: sinus tachycardia  Axis: nl  Intervals: Nonspecific intraventricular conduction delay  ST&T Change: No STEMI    RADIOLOGY I independently reviewed and interpreted chest x-ray and ET tube appears above the carina I also reviewed radiologist's formal read.   PROCEDURES:  Critical Care performed: Yes, see critical care procedure note(s)  .Critical Care  Performed by: Pilar Jarvis, MD Authorized by: Pilar Jarvis, MD   Critical care provider statement:    Critical care time (minutes):  45   Critical care was time spent personally by me on the following activities:  Development of treatment plan with patient or surrogate, discussions with consultants, evaluation of patient's response to treatment, examination of patient, ordering and review of laboratory studies, ordering and review of radiographic studies, ordering and performing treatments and interventions, pulse oximetry, re-evaluation of patient's condition and review of old charts Date/Time: 02/23/23 11:12 PM  Performed by: Pilar Jarvis, MDPre-anesthesia Checklist: Patient identified Induction Type: Rapid sequence Laryngoscope Size: Glidescope and 3 Number of attempts: 1 Placement Confirmation: ETT inserted through vocal cords under direct vision, CO2 detector, Breath sounds checked- equal and bilateral and Positive ETCO2 Secured at: 25 cm Tube secured with: ETT holder Future Recommendations: Recommend- induction with short-acting agent, and alternative techniques readily available       MEDICATIONS ORDERED IN ED: Medications  0.9 %  sodium chloride infusion (has no administration in time range)  propofol (DIPRIVAN) 1000 MG/100ML infusion (30 mcg/kg/min  84 kg Intravenous Infusion Verify 2023-02-23 2315)  methylPREDNISolone sodium succinate (SOLU-MEDROL) 40 mg/mL injection 40 mg (has no administration in time range)  diphenhydrAMINE (BENADRYL) capsule 50 mg (has no administration in time range)    Or   diphenhydrAMINE (BENADRYL) injection 50 mg (has no administration in time range)  fentaNYL in NS (59mcg/ml) infusion-PREMIX (75 mcg/hr Intravenous Rate/Dose Change 02/23/23 2248)  calcium gluconate 1 g/ 50 mL sodium chloride IVPB (has no administration in time range)  midazolam (VERSED) injection 2 mg (has no administration in time range)  docusate (COLACE) 50 MG/5ML liquid 100 mg (has no administration in time range)  polyethylene glycol (MIRALAX / GLYCOLAX) packet 17 g (has no administration in time range)  famotidine (PEPCID) tablet 20 mg (has no administration in time range)  docusate (COLACE) 50 MG/5ML liquid 100 mg (has no administration in time range)  polyethylene glycol (MIRALAX / GLYCOLAX) packet  17 g (has no administration in time range)  Oral care mouth rinse (has no administration in time range)  Oral care mouth rinse (has no administration in time range)  midazolam (VERSED) injection 1-2 mg (1 mg Intravenous Given 01/27/2023 2312)  0.9 %  sodium chloride infusion (has no administration in time range)  norepinephrine (LEVOPHED) 4mg  in (0.016 mg/mL) premix infusion (5 mcg/min Intravenous Infusion Verify 27-Jan-2023 2315)  succinylcholine (ANECTINE) syringe 100 mg (100 mg Intravenous Given 2023-01-27 2210)  etomidate (AMIDATE) injection 20 mg (20 mg Intravenous Given 01-27-23 2210)  furosemide (LASIX) injection 80 mg (80 mg Intravenous Given 01/27/2023 2235)    External physician / consultants:  I spoke with ICU consultants regarding care plan for this patient.   IMPRESSION / MDM / ASSESSMENT AND PLAN / ED COURSE  I reviewed the triage vital signs and the nursing notes.                                Patient's presentation is most consistent with acute presentation with potential threat to life or bodily function.  Differential diagnosis includes, but is not limited to, cardiac arrest due to cardiac ischemia, PE, electrolyte disturbance, respiratory arrest in the  setting of COPD or pulmonary edema   The patient is on the cardiac monitor to evaluate for evidence of arrhythmia and/or significant heart rate changes.  MDM:    Patient is status postcardiac arrest with ROSC achieved in the field.  Initially V-fib, then with nonspecific intraventricular conduction delay, tachycardic, discussed with STEMI doctor Dr. Juliann Pares who agrees not STEMI activation at this time.  B-lines diffusely with rales we will give IV Lasix for pulmonary edema.  On the ventilator now.  Bedside ultrasound with limited views but question enlarged right ventricle.  Will premedicate for contrast allergy and proceed with CT angiogram of the chest, and given her fall and altered mental status we will take a look at the CT head as well.  ICU admission.       FINAL CLINICAL IMPRESSION(S) / ED DIAGNOSES   Final diagnoses:  Cardiac arrest [facility])     Rx / DC Orders   ED Discharge Orders     None        Note:  This document was prepared using Dragon voice recognition software and may include unintentional dictation errors.    Pilar Jarvis, MD 2023-01-27 [phone]

## 2023-01-25 DIAGNOSIS — R579 Shock, unspecified: Secondary | ICD-10-CM | POA: Insufficient documentation

## 2023-02-06 NOTE — ED Notes (Addendum)
Pt asystole at this time without inspiration/expiration. RN auscultated for lung and heart sounds - none present. Agnes Lawrence, RN in room to confirm same. Time of death called at [phone]. 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

## 2023-02-06 NOTE — Progress Notes (Signed)
Pt extubated to 2L  per NP order. NP and RN at bedside.

## 2023-02-06 NOTE — ED Notes (Signed)
All lines removed and pt placed in body bag - toe and bag tags in place. ED secretary notified to place transportation request for pt to be moved to [facility] morgue.
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
## 2023-02-06 NOTE — ED Notes (Signed)
Daughter and family pastor remain at bedside - updated regarding pt status. Pt does not appear to be in any pain at this time. Visitors deny any needs at this time.

## 2023-02-06 NOTE — Progress Notes (Signed)
PHARMACY CONSULT NOTE - FOLLOW UP  Pharmacy Consult for Electrolyte Monitoring and Replacement   Recent Labs: Potassium (mmol/L)  Date Value  02/03/2023 3.7  08/08/2013 3.5   Calcium (mg/dL)  Date Value  16/02/9603 8.8 (L)   Calcium, Total (mg/dL)  Date Value  54/01/8118 8.4 (L)   Albumin (g/dL)  Date Value  14/78/2956 3.8  08/07/2013 3.5   Sodium (mmol/L)  Date Value  01/31/2023 132 (L)  08/08/2013 134 (L)   Assessment: 72 y.o. female presenting with vfib arrest. PMH significant for COPD, PAD, HTN, HLD, CAD, tobacco use. Pharmacy has been consulted to monitor and replace electrolytes.   Goal of Therapy:  Electrolytes WNL  Plan:  No electrolyte replacement is warranted today F/u BMET and Mg in AM  Thank you for involving pharmacy in this patient's care.   Rockwell Alexandria, PharmD Clinical Pharmacist 02/05/2023 7:54 AM

## 2023-02-06 NOTE — ED Notes (Signed)
Pts daughter and family pastor remain at bedside. Pt appears to be more comfortable - light grunting with expiration; use of accessory muscles with breathing continues but is less forceful.

## 2023-02-06 NOTE — ED Notes (Signed)
No change in pt status - daughter and family pastor remain at bedside.

## 2023-02-06 DEATH — deceased
